# Patient Record
Sex: Female | Born: 1935 | Race: White | Hispanic: No | Marital: Married | Smoking: Never smoker
Health system: Southern US, Community
[De-identification: ages and names within clinical notes are randomized; demographics above are authoritative.]

## PROBLEM LIST (undated history)

## (undated) DIAGNOSIS — E119 Type 2 diabetes mellitus without complications: Secondary | ICD-10-CM

## (undated) DIAGNOSIS — E7800 Pure hypercholesterolemia, unspecified: Secondary | ICD-10-CM

## (undated) DIAGNOSIS — I10 Essential (primary) hypertension: Secondary | ICD-10-CM

## (undated) DIAGNOSIS — M109 Gout, unspecified: Secondary | ICD-10-CM

## (undated) DIAGNOSIS — I6529 Occlusion and stenosis of unspecified carotid artery: Secondary | ICD-10-CM

## (undated) HISTORY — PX: CERVICAL FUSION: SHX112

## (undated) HISTORY — PX: EYE SURGERY: SHX253

## (undated) HISTORY — PX: ACHILLES TENDON REPAIR: SUR1153

## (undated) HISTORY — PX: KNEE SURGERY: SHX244

## (undated) HISTORY — PX: ABDOMINAL HYSTERECTOMY: SHX81

## (undated) HISTORY — DX: Occlusion and stenosis of unspecified carotid artery: I65.29

## (undated) HISTORY — PX: TONSILLECTOMY: SUR1361

---

## 2011-01-25 DIAGNOSIS — E119 Type 2 diabetes mellitus without complications: Secondary | ICD-10-CM | POA: Diagnosis not present

## 2011-01-25 DIAGNOSIS — I10 Essential (primary) hypertension: Secondary | ICD-10-CM | POA: Diagnosis not present

## 2011-02-08 DIAGNOSIS — N19 Unspecified kidney failure: Secondary | ICD-10-CM | POA: Diagnosis not present

## 2011-02-08 DIAGNOSIS — I10 Essential (primary) hypertension: Secondary | ICD-10-CM | POA: Diagnosis not present

## 2011-02-08 DIAGNOSIS — E119 Type 2 diabetes mellitus without complications: Secondary | ICD-10-CM | POA: Diagnosis not present

## 2011-02-08 DIAGNOSIS — E785 Hyperlipidemia, unspecified: Secondary | ICD-10-CM | POA: Diagnosis not present

## 2011-02-26 DIAGNOSIS — E119 Type 2 diabetes mellitus without complications: Secondary | ICD-10-CM | POA: Diagnosis not present

## 2011-02-26 DIAGNOSIS — L259 Unspecified contact dermatitis, unspecified cause: Secondary | ICD-10-CM | POA: Diagnosis not present

## 2011-06-01 DIAGNOSIS — I10 Essential (primary) hypertension: Secondary | ICD-10-CM | POA: Diagnosis not present

## 2011-06-01 DIAGNOSIS — E785 Hyperlipidemia, unspecified: Secondary | ICD-10-CM | POA: Diagnosis not present

## 2011-06-01 DIAGNOSIS — E119 Type 2 diabetes mellitus without complications: Secondary | ICD-10-CM | POA: Diagnosis not present

## 2011-06-06 DIAGNOSIS — M79609 Pain in unspecified limb: Secondary | ICD-10-CM | POA: Diagnosis not present

## 2011-06-06 DIAGNOSIS — B351 Tinea unguium: Secondary | ICD-10-CM | POA: Diagnosis not present

## 2011-06-06 DIAGNOSIS — E1349 Other specified diabetes mellitus with other diabetic neurological complication: Secondary | ICD-10-CM | POA: Diagnosis not present

## 2011-06-14 DIAGNOSIS — E119 Type 2 diabetes mellitus without complications: Secondary | ICD-10-CM | POA: Diagnosis not present

## 2011-06-14 DIAGNOSIS — H43819 Vitreous degeneration, unspecified eye: Secondary | ICD-10-CM | POA: Diagnosis not present

## 2011-06-26 DIAGNOSIS — N19 Unspecified kidney failure: Secondary | ICD-10-CM | POA: Diagnosis not present

## 2011-06-26 DIAGNOSIS — I10 Essential (primary) hypertension: Secondary | ICD-10-CM | POA: Diagnosis not present

## 2011-06-26 DIAGNOSIS — E119 Type 2 diabetes mellitus without complications: Secondary | ICD-10-CM | POA: Diagnosis not present

## 2011-06-26 DIAGNOSIS — E785 Hyperlipidemia, unspecified: Secondary | ICD-10-CM | POA: Diagnosis not present

## 2011-06-26 DIAGNOSIS — D509 Iron deficiency anemia, unspecified: Secondary | ICD-10-CM | POA: Diagnosis not present

## 2011-06-26 DIAGNOSIS — K921 Melena: Secondary | ICD-10-CM | POA: Diagnosis not present

## 2011-06-26 DIAGNOSIS — Z79899 Other long term (current) drug therapy: Secondary | ICD-10-CM | POA: Diagnosis not present

## 2011-06-28 DIAGNOSIS — Z1231 Encounter for screening mammogram for malignant neoplasm of breast: Secondary | ICD-10-CM | POA: Diagnosis not present

## 2011-07-02 DIAGNOSIS — E785 Hyperlipidemia, unspecified: Secondary | ICD-10-CM | POA: Diagnosis not present

## 2011-07-02 DIAGNOSIS — K921 Melena: Secondary | ICD-10-CM | POA: Diagnosis not present

## 2011-07-02 DIAGNOSIS — I10 Essential (primary) hypertension: Secondary | ICD-10-CM | POA: Diagnosis not present

## 2011-07-02 DIAGNOSIS — E119 Type 2 diabetes mellitus without complications: Secondary | ICD-10-CM | POA: Diagnosis not present

## 2011-07-02 DIAGNOSIS — N19 Unspecified kidney failure: Secondary | ICD-10-CM | POA: Diagnosis not present

## 2011-07-02 DIAGNOSIS — D509 Iron deficiency anemia, unspecified: Secondary | ICD-10-CM | POA: Diagnosis not present

## 2011-07-05 DIAGNOSIS — M545 Low back pain, unspecified: Secondary | ICD-10-CM | POA: Diagnosis not present

## 2011-07-05 DIAGNOSIS — M5430 Sciatica, unspecified side: Secondary | ICD-10-CM | POA: Diagnosis not present

## 2011-07-05 DIAGNOSIS — M5137 Other intervertebral disc degeneration, lumbosacral region: Secondary | ICD-10-CM | POA: Diagnosis not present

## 2011-07-06 DIAGNOSIS — E119 Type 2 diabetes mellitus without complications: Secondary | ICD-10-CM | POA: Diagnosis not present

## 2011-07-06 DIAGNOSIS — E782 Mixed hyperlipidemia: Secondary | ICD-10-CM | POA: Diagnosis not present

## 2011-07-06 DIAGNOSIS — I10 Essential (primary) hypertension: Secondary | ICD-10-CM | POA: Diagnosis not present

## 2011-07-06 DIAGNOSIS — I679 Cerebrovascular disease, unspecified: Secondary | ICD-10-CM | POA: Diagnosis not present

## 2011-07-14 DIAGNOSIS — M4120 Other idiopathic scoliosis, site unspecified: Secondary | ICD-10-CM | POA: Diagnosis not present

## 2011-07-14 DIAGNOSIS — M545 Low back pain, unspecified: Secondary | ICD-10-CM | POA: Diagnosis not present

## 2011-07-19 DIAGNOSIS — I10 Essential (primary) hypertension: Secondary | ICD-10-CM | POA: Diagnosis not present

## 2011-07-19 DIAGNOSIS — E785 Hyperlipidemia, unspecified: Secondary | ICD-10-CM | POA: Diagnosis not present

## 2011-07-19 DIAGNOSIS — E119 Type 2 diabetes mellitus without complications: Secondary | ICD-10-CM | POA: Diagnosis not present

## 2011-07-25 DIAGNOSIS — K921 Melena: Secondary | ICD-10-CM | POA: Diagnosis not present

## 2011-08-15 DIAGNOSIS — M545 Low back pain, unspecified: Secondary | ICD-10-CM | POA: Diagnosis not present

## 2011-08-15 DIAGNOSIS — M5137 Other intervertebral disc degeneration, lumbosacral region: Secondary | ICD-10-CM | POA: Diagnosis not present

## 2011-08-15 DIAGNOSIS — M5430 Sciatica, unspecified side: Secondary | ICD-10-CM | POA: Diagnosis not present

## 2011-09-03 DIAGNOSIS — H109 Unspecified conjunctivitis: Secondary | ICD-10-CM | POA: Diagnosis not present

## 2011-09-03 DIAGNOSIS — E119 Type 2 diabetes mellitus without complications: Secondary | ICD-10-CM | POA: Diagnosis not present

## 2011-09-03 DIAGNOSIS — I10 Essential (primary) hypertension: Secondary | ICD-10-CM | POA: Diagnosis not present

## 2011-09-10 DIAGNOSIS — H109 Unspecified conjunctivitis: Secondary | ICD-10-CM | POA: Diagnosis not present

## 2011-09-19 DIAGNOSIS — H905 Unspecified sensorineural hearing loss: Secondary | ICD-10-CM | POA: Diagnosis not present

## 2011-10-01 DIAGNOSIS — B351 Tinea unguium: Secondary | ICD-10-CM | POA: Diagnosis not present

## 2011-10-01 DIAGNOSIS — E1349 Other specified diabetes mellitus with other diabetic neurological complication: Secondary | ICD-10-CM | POA: Diagnosis not present

## 2011-10-04 DIAGNOSIS — Z23 Encounter for immunization: Secondary | ICD-10-CM | POA: Diagnosis not present

## 2011-10-30 DIAGNOSIS — Z01419 Encounter for gynecological examination (general) (routine) without abnormal findings: Secondary | ICD-10-CM | POA: Diagnosis not present

## 2011-10-30 DIAGNOSIS — Z124 Encounter for screening for malignant neoplasm of cervix: Secondary | ICD-10-CM | POA: Diagnosis not present

## 2011-12-27 DIAGNOSIS — Z79899 Other long term (current) drug therapy: Secondary | ICD-10-CM | POA: Diagnosis not present

## 2011-12-27 DIAGNOSIS — E785 Hyperlipidemia, unspecified: Secondary | ICD-10-CM | POA: Diagnosis not present

## 2012-01-04 DIAGNOSIS — I679 Cerebrovascular disease, unspecified: Secondary | ICD-10-CM | POA: Diagnosis not present

## 2012-01-04 DIAGNOSIS — E782 Mixed hyperlipidemia: Secondary | ICD-10-CM | POA: Diagnosis not present

## 2012-01-04 DIAGNOSIS — I10 Essential (primary) hypertension: Secondary | ICD-10-CM | POA: Diagnosis not present

## 2012-01-29 DIAGNOSIS — E1349 Other specified diabetes mellitus with other diabetic neurological complication: Secondary | ICD-10-CM | POA: Diagnosis not present

## 2012-01-29 DIAGNOSIS — L600 Ingrowing nail: Secondary | ICD-10-CM | POA: Diagnosis not present

## 2012-01-29 DIAGNOSIS — B351 Tinea unguium: Secondary | ICD-10-CM | POA: Diagnosis not present

## 2012-04-17 DIAGNOSIS — E785 Hyperlipidemia, unspecified: Secondary | ICD-10-CM | POA: Diagnosis not present

## 2012-04-17 DIAGNOSIS — N289 Disorder of kidney and ureter, unspecified: Secondary | ICD-10-CM | POA: Diagnosis not present

## 2012-04-17 DIAGNOSIS — Z79899 Other long term (current) drug therapy: Secondary | ICD-10-CM | POA: Diagnosis not present

## 2012-04-25 DIAGNOSIS — E782 Mixed hyperlipidemia: Secondary | ICD-10-CM | POA: Diagnosis not present

## 2012-04-25 DIAGNOSIS — E8881 Metabolic syndrome: Secondary | ICD-10-CM | POA: Diagnosis not present

## 2012-04-25 DIAGNOSIS — I10 Essential (primary) hypertension: Secondary | ICD-10-CM | POA: Diagnosis not present

## 2012-04-25 DIAGNOSIS — I679 Cerebrovascular disease, unspecified: Secondary | ICD-10-CM | POA: Diagnosis not present

## 2012-05-08 DIAGNOSIS — R279 Unspecified lack of coordination: Secondary | ICD-10-CM | POA: Diagnosis not present

## 2012-05-08 DIAGNOSIS — R262 Difficulty in walking, not elsewhere classified: Secondary | ICD-10-CM | POA: Diagnosis not present

## 2012-05-08 DIAGNOSIS — Z9181 History of falling: Secondary | ICD-10-CM | POA: Diagnosis not present

## 2012-05-08 DIAGNOSIS — R269 Unspecified abnormalities of gait and mobility: Secondary | ICD-10-CM | POA: Diagnosis not present

## 2012-05-12 DIAGNOSIS — R262 Difficulty in walking, not elsewhere classified: Secondary | ICD-10-CM | POA: Diagnosis not present

## 2012-05-12 DIAGNOSIS — R279 Unspecified lack of coordination: Secondary | ICD-10-CM | POA: Diagnosis not present

## 2012-05-12 DIAGNOSIS — Z9181 History of falling: Secondary | ICD-10-CM | POA: Diagnosis not present

## 2012-05-12 DIAGNOSIS — R269 Unspecified abnormalities of gait and mobility: Secondary | ICD-10-CM | POA: Diagnosis not present

## 2012-05-16 DIAGNOSIS — Z9181 History of falling: Secondary | ICD-10-CM | POA: Diagnosis not present

## 2012-05-16 DIAGNOSIS — R262 Difficulty in walking, not elsewhere classified: Secondary | ICD-10-CM | POA: Diagnosis not present

## 2012-05-16 DIAGNOSIS — R279 Unspecified lack of coordination: Secondary | ICD-10-CM | POA: Diagnosis not present

## 2012-05-16 DIAGNOSIS — R269 Unspecified abnormalities of gait and mobility: Secondary | ICD-10-CM | POA: Diagnosis not present

## 2012-05-20 DIAGNOSIS — R262 Difficulty in walking, not elsewhere classified: Secondary | ICD-10-CM | POA: Diagnosis not present

## 2012-05-20 DIAGNOSIS — Z9181 History of falling: Secondary | ICD-10-CM | POA: Diagnosis not present

## 2012-05-20 DIAGNOSIS — R269 Unspecified abnormalities of gait and mobility: Secondary | ICD-10-CM | POA: Diagnosis not present

## 2012-05-20 DIAGNOSIS — R279 Unspecified lack of coordination: Secondary | ICD-10-CM | POA: Diagnosis not present

## 2012-05-22 DIAGNOSIS — R262 Difficulty in walking, not elsewhere classified: Secondary | ICD-10-CM | POA: Diagnosis not present

## 2012-05-22 DIAGNOSIS — R269 Unspecified abnormalities of gait and mobility: Secondary | ICD-10-CM | POA: Diagnosis not present

## 2012-05-22 DIAGNOSIS — Z9181 History of falling: Secondary | ICD-10-CM | POA: Diagnosis not present

## 2012-05-22 DIAGNOSIS — R279 Unspecified lack of coordination: Secondary | ICD-10-CM | POA: Diagnosis not present

## 2012-05-26 DIAGNOSIS — R262 Difficulty in walking, not elsewhere classified: Secondary | ICD-10-CM | POA: Diagnosis not present

## 2012-05-26 DIAGNOSIS — R269 Unspecified abnormalities of gait and mobility: Secondary | ICD-10-CM | POA: Diagnosis not present

## 2012-05-26 DIAGNOSIS — R279 Unspecified lack of coordination: Secondary | ICD-10-CM | POA: Diagnosis not present

## 2012-05-26 DIAGNOSIS — Z9181 History of falling: Secondary | ICD-10-CM | POA: Diagnosis not present

## 2012-05-27 DIAGNOSIS — B351 Tinea unguium: Secondary | ICD-10-CM | POA: Diagnosis not present

## 2012-05-27 DIAGNOSIS — E1349 Other specified diabetes mellitus with other diabetic neurological complication: Secondary | ICD-10-CM | POA: Diagnosis not present

## 2012-05-30 DIAGNOSIS — R262 Difficulty in walking, not elsewhere classified: Secondary | ICD-10-CM | POA: Diagnosis not present

## 2012-05-30 DIAGNOSIS — Z9181 History of falling: Secondary | ICD-10-CM | POA: Diagnosis not present

## 2012-05-30 DIAGNOSIS — R269 Unspecified abnormalities of gait and mobility: Secondary | ICD-10-CM | POA: Diagnosis not present

## 2012-05-30 DIAGNOSIS — R279 Unspecified lack of coordination: Secondary | ICD-10-CM | POA: Diagnosis not present

## 2012-06-02 DIAGNOSIS — R269 Unspecified abnormalities of gait and mobility: Secondary | ICD-10-CM | POA: Diagnosis not present

## 2012-06-02 DIAGNOSIS — Z9181 History of falling: Secondary | ICD-10-CM | POA: Diagnosis not present

## 2012-06-02 DIAGNOSIS — R279 Unspecified lack of coordination: Secondary | ICD-10-CM | POA: Diagnosis not present

## 2012-06-02 DIAGNOSIS — R262 Difficulty in walking, not elsewhere classified: Secondary | ICD-10-CM | POA: Diagnosis not present

## 2012-06-05 DIAGNOSIS — Z9181 History of falling: Secondary | ICD-10-CM | POA: Diagnosis not present

## 2012-06-05 DIAGNOSIS — R262 Difficulty in walking, not elsewhere classified: Secondary | ICD-10-CM | POA: Diagnosis not present

## 2012-06-05 DIAGNOSIS — R269 Unspecified abnormalities of gait and mobility: Secondary | ICD-10-CM | POA: Diagnosis not present

## 2012-06-05 DIAGNOSIS — R279 Unspecified lack of coordination: Secondary | ICD-10-CM | POA: Diagnosis not present

## 2012-06-06 DIAGNOSIS — L719 Rosacea, unspecified: Secondary | ICD-10-CM | POA: Diagnosis not present

## 2012-06-06 DIAGNOSIS — I10 Essential (primary) hypertension: Secondary | ICD-10-CM | POA: Diagnosis not present

## 2012-06-06 DIAGNOSIS — E785 Hyperlipidemia, unspecified: Secondary | ICD-10-CM | POA: Diagnosis not present

## 2012-06-06 DIAGNOSIS — E119 Type 2 diabetes mellitus without complications: Secondary | ICD-10-CM | POA: Diagnosis not present

## 2012-06-10 DIAGNOSIS — R279 Unspecified lack of coordination: Secondary | ICD-10-CM | POA: Diagnosis not present

## 2012-06-10 DIAGNOSIS — Z9181 History of falling: Secondary | ICD-10-CM | POA: Diagnosis not present

## 2012-06-10 DIAGNOSIS — R262 Difficulty in walking, not elsewhere classified: Secondary | ICD-10-CM | POA: Diagnosis not present

## 2012-06-10 DIAGNOSIS — R269 Unspecified abnormalities of gait and mobility: Secondary | ICD-10-CM | POA: Diagnosis not present

## 2012-06-12 DIAGNOSIS — I10 Essential (primary) hypertension: Secondary | ICD-10-CM | POA: Diagnosis not present

## 2012-06-12 DIAGNOSIS — N19 Unspecified kidney failure: Secondary | ICD-10-CM | POA: Diagnosis not present

## 2012-06-12 DIAGNOSIS — R262 Difficulty in walking, not elsewhere classified: Secondary | ICD-10-CM | POA: Diagnosis not present

## 2012-06-12 DIAGNOSIS — R279 Unspecified lack of coordination: Secondary | ICD-10-CM | POA: Diagnosis not present

## 2012-06-12 DIAGNOSIS — Z9181 History of falling: Secondary | ICD-10-CM | POA: Diagnosis not present

## 2012-06-12 DIAGNOSIS — Z79899 Other long term (current) drug therapy: Secondary | ICD-10-CM | POA: Diagnosis not present

## 2012-06-12 DIAGNOSIS — E119 Type 2 diabetes mellitus without complications: Secondary | ICD-10-CM | POA: Diagnosis not present

## 2012-06-12 DIAGNOSIS — K921 Melena: Secondary | ICD-10-CM | POA: Diagnosis not present

## 2012-06-12 DIAGNOSIS — E785 Hyperlipidemia, unspecified: Secondary | ICD-10-CM | POA: Diagnosis not present

## 2012-06-12 DIAGNOSIS — R269 Unspecified abnormalities of gait and mobility: Secondary | ICD-10-CM | POA: Diagnosis not present

## 2012-06-17 DIAGNOSIS — R269 Unspecified abnormalities of gait and mobility: Secondary | ICD-10-CM | POA: Diagnosis not present

## 2012-06-17 DIAGNOSIS — Z9181 History of falling: Secondary | ICD-10-CM | POA: Diagnosis not present

## 2012-06-17 DIAGNOSIS — R279 Unspecified lack of coordination: Secondary | ICD-10-CM | POA: Diagnosis not present

## 2012-06-17 DIAGNOSIS — R262 Difficulty in walking, not elsewhere classified: Secondary | ICD-10-CM | POA: Diagnosis not present

## 2012-06-19 DIAGNOSIS — K921 Melena: Secondary | ICD-10-CM | POA: Diagnosis not present

## 2012-06-19 DIAGNOSIS — N19 Unspecified kidney failure: Secondary | ICD-10-CM | POA: Diagnosis not present

## 2012-06-19 DIAGNOSIS — E785 Hyperlipidemia, unspecified: Secondary | ICD-10-CM | POA: Diagnosis not present

## 2012-06-19 DIAGNOSIS — Z9181 History of falling: Secondary | ICD-10-CM | POA: Diagnosis not present

## 2012-06-19 DIAGNOSIS — I10 Essential (primary) hypertension: Secondary | ICD-10-CM | POA: Diagnosis not present

## 2012-06-19 DIAGNOSIS — R262 Difficulty in walking, not elsewhere classified: Secondary | ICD-10-CM | POA: Diagnosis not present

## 2012-06-19 DIAGNOSIS — Z79899 Other long term (current) drug therapy: Secondary | ICD-10-CM | POA: Diagnosis not present

## 2012-06-19 DIAGNOSIS — R269 Unspecified abnormalities of gait and mobility: Secondary | ICD-10-CM | POA: Diagnosis not present

## 2012-06-19 DIAGNOSIS — R279 Unspecified lack of coordination: Secondary | ICD-10-CM | POA: Diagnosis not present

## 2012-06-19 DIAGNOSIS — E119 Type 2 diabetes mellitus without complications: Secondary | ICD-10-CM | POA: Diagnosis not present

## 2012-06-24 DIAGNOSIS — Z9181 History of falling: Secondary | ICD-10-CM | POA: Diagnosis not present

## 2012-06-24 DIAGNOSIS — R279 Unspecified lack of coordination: Secondary | ICD-10-CM | POA: Diagnosis not present

## 2012-06-24 DIAGNOSIS — R262 Difficulty in walking, not elsewhere classified: Secondary | ICD-10-CM | POA: Diagnosis not present

## 2012-06-24 DIAGNOSIS — R269 Unspecified abnormalities of gait and mobility: Secondary | ICD-10-CM | POA: Diagnosis not present

## 2012-06-26 DIAGNOSIS — E119 Type 2 diabetes mellitus without complications: Secondary | ICD-10-CM | POA: Diagnosis not present

## 2012-06-26 DIAGNOSIS — R279 Unspecified lack of coordination: Secondary | ICD-10-CM | POA: Diagnosis not present

## 2012-06-26 DIAGNOSIS — Z9181 History of falling: Secondary | ICD-10-CM | POA: Diagnosis not present

## 2012-06-26 DIAGNOSIS — R7989 Other specified abnormal findings of blood chemistry: Secondary | ICD-10-CM | POA: Diagnosis not present

## 2012-06-26 DIAGNOSIS — R262 Difficulty in walking, not elsewhere classified: Secondary | ICD-10-CM | POA: Diagnosis not present

## 2012-06-26 DIAGNOSIS — M79609 Pain in unspecified limb: Secondary | ICD-10-CM | POA: Diagnosis not present

## 2012-06-26 DIAGNOSIS — E785 Hyperlipidemia, unspecified: Secondary | ICD-10-CM | POA: Diagnosis not present

## 2012-06-26 DIAGNOSIS — Z79899 Other long term (current) drug therapy: Secondary | ICD-10-CM | POA: Diagnosis not present

## 2012-06-26 DIAGNOSIS — R269 Unspecified abnormalities of gait and mobility: Secondary | ICD-10-CM | POA: Diagnosis not present

## 2012-06-26 DIAGNOSIS — I10 Essential (primary) hypertension: Secondary | ICD-10-CM | POA: Diagnosis not present

## 2012-06-26 DIAGNOSIS — R700 Elevated erythrocyte sedimentation rate: Secondary | ICD-10-CM | POA: Diagnosis not present

## 2012-06-26 DIAGNOSIS — M109 Gout, unspecified: Secondary | ICD-10-CM | POA: Diagnosis not present

## 2012-06-26 DIAGNOSIS — M7989 Other specified soft tissue disorders: Secondary | ICD-10-CM | POA: Diagnosis not present

## 2012-06-30 DIAGNOSIS — R7989 Other specified abnormal findings of blood chemistry: Secondary | ICD-10-CM | POA: Diagnosis not present

## 2012-07-01 DIAGNOSIS — R262 Difficulty in walking, not elsewhere classified: Secondary | ICD-10-CM | POA: Diagnosis not present

## 2012-07-01 DIAGNOSIS — R269 Unspecified abnormalities of gait and mobility: Secondary | ICD-10-CM | POA: Diagnosis not present

## 2012-07-01 DIAGNOSIS — R279 Unspecified lack of coordination: Secondary | ICD-10-CM | POA: Diagnosis not present

## 2012-07-01 DIAGNOSIS — Z1231 Encounter for screening mammogram for malignant neoplasm of breast: Secondary | ICD-10-CM | POA: Diagnosis not present

## 2012-07-01 DIAGNOSIS — Z9181 History of falling: Secondary | ICD-10-CM | POA: Diagnosis not present

## 2012-07-02 DIAGNOSIS — E785 Hyperlipidemia, unspecified: Secondary | ICD-10-CM | POA: Diagnosis not present

## 2012-07-02 DIAGNOSIS — E119 Type 2 diabetes mellitus without complications: Secondary | ICD-10-CM | POA: Diagnosis not present

## 2012-07-02 DIAGNOSIS — M109 Gout, unspecified: Secondary | ICD-10-CM | POA: Diagnosis not present

## 2012-07-02 DIAGNOSIS — I10 Essential (primary) hypertension: Secondary | ICD-10-CM | POA: Diagnosis not present

## 2012-07-02 DIAGNOSIS — R7989 Other specified abnormal findings of blood chemistry: Secondary | ICD-10-CM | POA: Diagnosis not present

## 2012-07-04 DIAGNOSIS — R262 Difficulty in walking, not elsewhere classified: Secondary | ICD-10-CM | POA: Diagnosis not present

## 2012-07-04 DIAGNOSIS — R269 Unspecified abnormalities of gait and mobility: Secondary | ICD-10-CM | POA: Diagnosis not present

## 2012-07-04 DIAGNOSIS — Z9181 History of falling: Secondary | ICD-10-CM | POA: Diagnosis not present

## 2012-07-04 DIAGNOSIS — R279 Unspecified lack of coordination: Secondary | ICD-10-CM | POA: Diagnosis not present

## 2012-07-07 DIAGNOSIS — R279 Unspecified lack of coordination: Secondary | ICD-10-CM | POA: Diagnosis not present

## 2012-07-07 DIAGNOSIS — R269 Unspecified abnormalities of gait and mobility: Secondary | ICD-10-CM | POA: Diagnosis not present

## 2012-07-07 DIAGNOSIS — Z9181 History of falling: Secondary | ICD-10-CM | POA: Diagnosis not present

## 2012-07-07 DIAGNOSIS — R262 Difficulty in walking, not elsewhere classified: Secondary | ICD-10-CM | POA: Diagnosis not present

## 2012-07-16 DIAGNOSIS — M109 Gout, unspecified: Secondary | ICD-10-CM | POA: Diagnosis not present

## 2012-07-16 DIAGNOSIS — I10 Essential (primary) hypertension: Secondary | ICD-10-CM | POA: Diagnosis not present

## 2012-07-17 DIAGNOSIS — R279 Unspecified lack of coordination: Secondary | ICD-10-CM | POA: Diagnosis not present

## 2012-07-17 DIAGNOSIS — Z9181 History of falling: Secondary | ICD-10-CM | POA: Diagnosis not present

## 2012-07-17 DIAGNOSIS — R269 Unspecified abnormalities of gait and mobility: Secondary | ICD-10-CM | POA: Diagnosis not present

## 2012-07-17 DIAGNOSIS — R262 Difficulty in walking, not elsewhere classified: Secondary | ICD-10-CM | POA: Diagnosis not present

## 2012-09-25 DIAGNOSIS — E785 Hyperlipidemia, unspecified: Secondary | ICD-10-CM | POA: Diagnosis not present

## 2012-09-25 DIAGNOSIS — Z79899 Other long term (current) drug therapy: Secondary | ICD-10-CM | POA: Diagnosis not present

## 2012-09-25 DIAGNOSIS — N289 Disorder of kidney and ureter, unspecified: Secondary | ICD-10-CM | POA: Diagnosis not present

## 2012-09-26 DIAGNOSIS — E119 Type 2 diabetes mellitus without complications: Secondary | ICD-10-CM | POA: Diagnosis not present

## 2012-09-26 DIAGNOSIS — H26499 Other secondary cataract, unspecified eye: Secondary | ICD-10-CM | POA: Diagnosis not present

## 2012-09-30 DIAGNOSIS — E1349 Other specified diabetes mellitus with other diabetic neurological complication: Secondary | ICD-10-CM | POA: Diagnosis not present

## 2012-09-30 DIAGNOSIS — B351 Tinea unguium: Secondary | ICD-10-CM | POA: Diagnosis not present

## 2012-10-06 DIAGNOSIS — E782 Mixed hyperlipidemia: Secondary | ICD-10-CM | POA: Diagnosis not present

## 2012-10-06 DIAGNOSIS — M109 Gout, unspecified: Secondary | ICD-10-CM | POA: Diagnosis not present

## 2012-10-06 DIAGNOSIS — I10 Essential (primary) hypertension: Secondary | ICD-10-CM | POA: Diagnosis not present

## 2012-10-06 DIAGNOSIS — I679 Cerebrovascular disease, unspecified: Secondary | ICD-10-CM | POA: Diagnosis not present

## 2012-10-09 DIAGNOSIS — I10 Essential (primary) hypertension: Secondary | ICD-10-CM | POA: Diagnosis not present

## 2012-10-09 DIAGNOSIS — M109 Gout, unspecified: Secondary | ICD-10-CM | POA: Diagnosis not present

## 2012-10-09 DIAGNOSIS — E785 Hyperlipidemia, unspecified: Secondary | ICD-10-CM | POA: Diagnosis not present

## 2012-10-09 DIAGNOSIS — Z23 Encounter for immunization: Secondary | ICD-10-CM | POA: Diagnosis not present

## 2012-10-30 DIAGNOSIS — K921 Melena: Secondary | ICD-10-CM | POA: Diagnosis not present

## 2012-10-30 DIAGNOSIS — E119 Type 2 diabetes mellitus without complications: Secondary | ICD-10-CM | POA: Diagnosis not present

## 2012-10-30 DIAGNOSIS — Z79899 Other long term (current) drug therapy: Secondary | ICD-10-CM | POA: Diagnosis not present

## 2012-10-30 DIAGNOSIS — N19 Unspecified kidney failure: Secondary | ICD-10-CM | POA: Diagnosis not present

## 2012-10-30 DIAGNOSIS — I10 Essential (primary) hypertension: Secondary | ICD-10-CM | POA: Diagnosis not present

## 2012-10-30 DIAGNOSIS — E785 Hyperlipidemia, unspecified: Secondary | ICD-10-CM | POA: Diagnosis not present

## 2012-11-03 DIAGNOSIS — Z01419 Encounter for gynecological examination (general) (routine) without abnormal findings: Secondary | ICD-10-CM | POA: Diagnosis not present

## 2012-12-25 DIAGNOSIS — Z6832 Body mass index (BMI) 32.0-32.9, adult: Secondary | ICD-10-CM | POA: Diagnosis not present

## 2012-12-25 DIAGNOSIS — E1169 Type 2 diabetes mellitus with other specified complication: Secondary | ICD-10-CM | POA: Diagnosis not present

## 2012-12-25 DIAGNOSIS — I10 Essential (primary) hypertension: Secondary | ICD-10-CM | POA: Diagnosis not present

## 2012-12-25 DIAGNOSIS — E785 Hyperlipidemia, unspecified: Secondary | ICD-10-CM | POA: Diagnosis not present

## 2012-12-25 DIAGNOSIS — Z1331 Encounter for screening for depression: Secondary | ICD-10-CM | POA: Diagnosis not present

## 2012-12-25 DIAGNOSIS — M109 Gout, unspecified: Secondary | ICD-10-CM | POA: Diagnosis not present

## 2012-12-25 DIAGNOSIS — G459 Transient cerebral ischemic attack, unspecified: Secondary | ICD-10-CM | POA: Diagnosis not present

## 2012-12-25 DIAGNOSIS — B351 Tinea unguium: Secondary | ICD-10-CM | POA: Diagnosis not present

## 2012-12-30 DIAGNOSIS — E119 Type 2 diabetes mellitus without complications: Secondary | ICD-10-CM | POA: Diagnosis not present

## 2012-12-30 DIAGNOSIS — L608 Other nail disorders: Secondary | ICD-10-CM | POA: Diagnosis not present

## 2012-12-30 DIAGNOSIS — M2010 Hallux valgus (acquired), unspecified foot: Secondary | ICD-10-CM | POA: Diagnosis not present

## 2012-12-30 DIAGNOSIS — S9030XA Contusion of unspecified foot, initial encounter: Secondary | ICD-10-CM | POA: Diagnosis not present

## 2013-03-31 DIAGNOSIS — L608 Other nail disorders: Secondary | ICD-10-CM | POA: Diagnosis not present

## 2013-03-31 DIAGNOSIS — E119 Type 2 diabetes mellitus without complications: Secondary | ICD-10-CM | POA: Diagnosis not present

## 2013-04-29 DIAGNOSIS — Z6833 Body mass index (BMI) 33.0-33.9, adult: Secondary | ICD-10-CM | POA: Diagnosis not present

## 2013-04-29 DIAGNOSIS — E785 Hyperlipidemia, unspecified: Secondary | ICD-10-CM | POA: Diagnosis not present

## 2013-04-29 DIAGNOSIS — R42 Dizziness and giddiness: Secondary | ICD-10-CM | POA: Diagnosis not present

## 2013-04-29 DIAGNOSIS — I10 Essential (primary) hypertension: Secondary | ICD-10-CM | POA: Diagnosis not present

## 2013-04-29 DIAGNOSIS — E119 Type 2 diabetes mellitus without complications: Secondary | ICD-10-CM | POA: Diagnosis not present

## 2013-04-29 DIAGNOSIS — R059 Cough, unspecified: Secondary | ICD-10-CM | POA: Diagnosis not present

## 2013-04-29 DIAGNOSIS — M79609 Pain in unspecified limb: Secondary | ICD-10-CM | POA: Diagnosis not present

## 2013-04-29 DIAGNOSIS — R05 Cough: Secondary | ICD-10-CM | POA: Diagnosis not present

## 2013-05-11 DIAGNOSIS — E119 Type 2 diabetes mellitus without complications: Secondary | ICD-10-CM | POA: Diagnosis not present

## 2013-05-11 DIAGNOSIS — H2640 Unspecified secondary cataract: Secondary | ICD-10-CM | POA: Diagnosis not present

## 2013-05-11 DIAGNOSIS — Z961 Presence of intraocular lens: Secondary | ICD-10-CM | POA: Diagnosis not present

## 2013-05-26 DIAGNOSIS — H2640 Unspecified secondary cataract: Secondary | ICD-10-CM | POA: Diagnosis not present

## 2013-05-26 DIAGNOSIS — H26499 Other secondary cataract, unspecified eye: Secondary | ICD-10-CM | POA: Diagnosis not present

## 2013-06-18 DIAGNOSIS — K5732 Diverticulitis of large intestine without perforation or abscess without bleeding: Secondary | ICD-10-CM | POA: Diagnosis not present

## 2013-06-18 DIAGNOSIS — Z6833 Body mass index (BMI) 33.0-33.9, adult: Secondary | ICD-10-CM | POA: Diagnosis not present

## 2013-06-30 DIAGNOSIS — L608 Other nail disorders: Secondary | ICD-10-CM | POA: Diagnosis not present

## 2013-06-30 DIAGNOSIS — E119 Type 2 diabetes mellitus without complications: Secondary | ICD-10-CM | POA: Diagnosis not present

## 2013-09-01 DIAGNOSIS — E119 Type 2 diabetes mellitus without complications: Secondary | ICD-10-CM | POA: Diagnosis not present

## 2013-09-01 DIAGNOSIS — M7660 Achilles tendinitis, unspecified leg: Secondary | ICD-10-CM | POA: Diagnosis not present

## 2013-09-01 DIAGNOSIS — L608 Other nail disorders: Secondary | ICD-10-CM | POA: Diagnosis not present

## 2013-09-01 DIAGNOSIS — M898X9 Other specified disorders of bone, unspecified site: Secondary | ICD-10-CM | POA: Diagnosis not present

## 2013-09-23 DIAGNOSIS — M7660 Achilles tendinitis, unspecified leg: Secondary | ICD-10-CM | POA: Diagnosis not present

## 2013-10-27 DIAGNOSIS — M109 Gout, unspecified: Secondary | ICD-10-CM | POA: Diagnosis not present

## 2013-10-27 DIAGNOSIS — E119 Type 2 diabetes mellitus without complications: Secondary | ICD-10-CM | POA: Diagnosis not present

## 2013-10-27 DIAGNOSIS — I10 Essential (primary) hypertension: Secondary | ICD-10-CM | POA: Diagnosis not present

## 2013-10-27 DIAGNOSIS — R8299 Other abnormal findings in urine: Secondary | ICD-10-CM | POA: Diagnosis not present

## 2013-10-27 DIAGNOSIS — E785 Hyperlipidemia, unspecified: Secondary | ICD-10-CM | POA: Diagnosis not present

## 2013-10-27 DIAGNOSIS — E1151 Type 2 diabetes mellitus with diabetic peripheral angiopathy without gangrene: Secondary | ICD-10-CM | POA: Diagnosis not present

## 2013-11-05 ENCOUNTER — Other Ambulatory Visit: Payer: Self-pay | Admitting: Internal Medicine

## 2013-11-05 DIAGNOSIS — Z008 Encounter for other general examination: Secondary | ICD-10-CM | POA: Diagnosis not present

## 2013-11-05 DIAGNOSIS — Z23 Encounter for immunization: Secondary | ICD-10-CM | POA: Diagnosis not present

## 2013-11-05 DIAGNOSIS — B351 Tinea unguium: Secondary | ICD-10-CM | POA: Diagnosis not present

## 2013-11-05 DIAGNOSIS — M109 Gout, unspecified: Secondary | ICD-10-CM | POA: Diagnosis not present

## 2013-11-05 DIAGNOSIS — E119 Type 2 diabetes mellitus without complications: Secondary | ICD-10-CM | POA: Diagnosis not present

## 2013-11-05 DIAGNOSIS — E785 Hyperlipidemia, unspecified: Secondary | ICD-10-CM | POA: Diagnosis not present

## 2013-11-05 DIAGNOSIS — G459 Transient cerebral ischemic attack, unspecified: Secondary | ICD-10-CM | POA: Diagnosis not present

## 2013-11-05 DIAGNOSIS — Z1212 Encounter for screening for malignant neoplasm of rectum: Secondary | ICD-10-CM | POA: Diagnosis not present

## 2013-11-05 DIAGNOSIS — I10 Essential (primary) hypertension: Secondary | ICD-10-CM | POA: Diagnosis not present

## 2013-11-05 DIAGNOSIS — R42 Dizziness and giddiness: Secondary | ICD-10-CM | POA: Diagnosis not present

## 2013-11-05 DIAGNOSIS — Z1231 Encounter for screening mammogram for malignant neoplasm of breast: Secondary | ICD-10-CM

## 2013-11-09 DIAGNOSIS — L602 Onychogryphosis: Secondary | ICD-10-CM | POA: Diagnosis not present

## 2013-11-09 DIAGNOSIS — E119 Type 2 diabetes mellitus without complications: Secondary | ICD-10-CM | POA: Diagnosis not present

## 2013-11-13 DIAGNOSIS — L820 Inflamed seborrheic keratosis: Secondary | ICD-10-CM | POA: Diagnosis not present

## 2013-11-13 DIAGNOSIS — M109 Gout, unspecified: Secondary | ICD-10-CM | POA: Diagnosis not present

## 2013-11-13 DIAGNOSIS — L821 Other seborrheic keratosis: Secondary | ICD-10-CM | POA: Diagnosis not present

## 2013-11-13 DIAGNOSIS — E119 Type 2 diabetes mellitus without complications: Secondary | ICD-10-CM | POA: Diagnosis not present

## 2013-11-13 DIAGNOSIS — G459 Transient cerebral ischemic attack, unspecified: Secondary | ICD-10-CM | POA: Diagnosis not present

## 2013-11-13 DIAGNOSIS — B351 Tinea unguium: Secondary | ICD-10-CM | POA: Diagnosis not present

## 2013-11-13 DIAGNOSIS — Z008 Encounter for other general examination: Secondary | ICD-10-CM | POA: Diagnosis not present

## 2013-11-13 DIAGNOSIS — R42 Dizziness and giddiness: Secondary | ICD-10-CM | POA: Diagnosis not present

## 2013-11-13 DIAGNOSIS — I10 Essential (primary) hypertension: Secondary | ICD-10-CM | POA: Diagnosis not present

## 2013-11-13 DIAGNOSIS — L438 Other lichen planus: Secondary | ICD-10-CM | POA: Diagnosis not present

## 2013-11-13 DIAGNOSIS — E785 Hyperlipidemia, unspecified: Secondary | ICD-10-CM | POA: Diagnosis not present

## 2013-11-19 ENCOUNTER — Encounter (INDEPENDENT_AMBULATORY_CARE_PROVIDER_SITE_OTHER): Payer: Self-pay

## 2013-11-19 ENCOUNTER — Ambulatory Visit
Admission: RE | Admit: 2013-11-19 | Discharge: 2013-11-19 | Disposition: A | Discharge status: Home / Self Care | Payer: Medicare Other | Source: Ambulatory Visit | Attending: Internal Medicine | Admitting: Internal Medicine

## 2013-11-19 DIAGNOSIS — Z1231 Encounter for screening mammogram for malignant neoplasm of breast: Secondary | ICD-10-CM

## 2014-01-11 DIAGNOSIS — E1151 Type 2 diabetes mellitus with diabetic peripheral angiopathy without gangrene: Secondary | ICD-10-CM | POA: Diagnosis not present

## 2014-01-11 DIAGNOSIS — L84 Corns and callosities: Secondary | ICD-10-CM | POA: Diagnosis not present

## 2014-01-11 DIAGNOSIS — L602 Onychogryphosis: Secondary | ICD-10-CM | POA: Diagnosis not present

## 2014-02-03 DIAGNOSIS — E1151 Type 2 diabetes mellitus with diabetic peripheral angiopathy without gangrene: Secondary | ICD-10-CM | POA: Diagnosis not present

## 2014-02-03 DIAGNOSIS — M109 Gout, unspecified: Secondary | ICD-10-CM | POA: Diagnosis not present

## 2014-02-03 DIAGNOSIS — R42 Dizziness and giddiness: Secondary | ICD-10-CM | POA: Diagnosis not present

## 2014-02-03 DIAGNOSIS — Z6835 Body mass index (BMI) 35.0-35.9, adult: Secondary | ICD-10-CM | POA: Diagnosis not present

## 2014-02-03 DIAGNOSIS — I10 Essential (primary) hypertension: Secondary | ICD-10-CM | POA: Diagnosis not present

## 2014-02-03 DIAGNOSIS — E119 Type 2 diabetes mellitus without complications: Secondary | ICD-10-CM | POA: Diagnosis not present

## 2014-04-12 DIAGNOSIS — L84 Corns and callosities: Secondary | ICD-10-CM | POA: Diagnosis not present

## 2014-04-12 DIAGNOSIS — L602 Onychogryphosis: Secondary | ICD-10-CM | POA: Diagnosis not present

## 2014-04-12 DIAGNOSIS — E1151 Type 2 diabetes mellitus with diabetic peripheral angiopathy without gangrene: Secondary | ICD-10-CM | POA: Diagnosis not present

## 2014-06-04 DIAGNOSIS — Z6835 Body mass index (BMI) 35.0-35.9, adult: Secondary | ICD-10-CM | POA: Diagnosis not present

## 2014-06-04 DIAGNOSIS — E1151 Type 2 diabetes mellitus with diabetic peripheral angiopathy without gangrene: Secondary | ICD-10-CM | POA: Diagnosis not present

## 2014-06-04 DIAGNOSIS — I10 Essential (primary) hypertension: Secondary | ICD-10-CM | POA: Diagnosis not present

## 2014-06-04 DIAGNOSIS — E119 Type 2 diabetes mellitus without complications: Secondary | ICD-10-CM | POA: Diagnosis not present

## 2014-06-04 DIAGNOSIS — E785 Hyperlipidemia, unspecified: Secondary | ICD-10-CM | POA: Diagnosis not present

## 2014-06-04 DIAGNOSIS — M436 Torticollis: Secondary | ICD-10-CM | POA: Diagnosis not present

## 2014-07-14 DIAGNOSIS — E1151 Type 2 diabetes mellitus with diabetic peripheral angiopathy without gangrene: Secondary | ICD-10-CM | POA: Diagnosis not present

## 2014-07-14 DIAGNOSIS — L84 Corns and callosities: Secondary | ICD-10-CM | POA: Diagnosis not present

## 2014-07-14 DIAGNOSIS — L602 Onychogryphosis: Secondary | ICD-10-CM | POA: Diagnosis not present

## 2014-07-23 DIAGNOSIS — Z961 Presence of intraocular lens: Secondary | ICD-10-CM | POA: Diagnosis not present

## 2014-07-23 DIAGNOSIS — E119 Type 2 diabetes mellitus without complications: Secondary | ICD-10-CM | POA: Diagnosis not present

## 2014-08-24 ENCOUNTER — Encounter (HOSPITAL_COMMUNITY): Payer: Self-pay

## 2014-08-24 ENCOUNTER — Emergency Department (HOSPITAL_COMMUNITY): Payer: Medicare Other

## 2014-08-24 ENCOUNTER — Inpatient Hospital Stay (HOSPITAL_COMMUNITY)
Admission: EM | Admit: 2014-08-24 | Discharge: 2014-08-25 | DRG: 312 | Disposition: A | Discharge status: Home / Self Care | Payer: Medicare Other | Attending: Internal Medicine | Admitting: Internal Medicine

## 2014-08-24 DIAGNOSIS — Z888 Allergy status to other drugs, medicaments and biological substances status: Secondary | ICD-10-CM

## 2014-08-24 DIAGNOSIS — R55 Syncope and collapse: Secondary | ICD-10-CM | POA: Diagnosis not present

## 2014-08-24 DIAGNOSIS — M109 Gout, unspecified: Secondary | ICD-10-CM | POA: Diagnosis present

## 2014-08-24 DIAGNOSIS — W01198A Fall on same level from slipping, tripping and stumbling with subsequent striking against other object, initial encounter: Secondary | ICD-10-CM | POA: Diagnosis present

## 2014-08-24 DIAGNOSIS — R42 Dizziness and giddiness: Secondary | ICD-10-CM | POA: Diagnosis present

## 2014-08-24 DIAGNOSIS — Z7902 Long term (current) use of antithrombotics/antiplatelets: Secondary | ICD-10-CM | POA: Diagnosis not present

## 2014-08-24 DIAGNOSIS — R000 Tachycardia, unspecified: Secondary | ICD-10-CM | POA: Diagnosis not present

## 2014-08-24 DIAGNOSIS — E119 Type 2 diabetes mellitus without complications: Secondary | ICD-10-CM | POA: Diagnosis not present

## 2014-08-24 DIAGNOSIS — Z886 Allergy status to analgesic agent status: Secondary | ICD-10-CM | POA: Diagnosis not present

## 2014-08-24 DIAGNOSIS — Y929 Unspecified place or not applicable: Secondary | ICD-10-CM | POA: Diagnosis not present

## 2014-08-24 DIAGNOSIS — S2231XA Fracture of one rib, right side, initial encounter for closed fracture: Secondary | ICD-10-CM

## 2014-08-24 DIAGNOSIS — Z91010 Allergy to peanuts: Secondary | ICD-10-CM

## 2014-08-24 DIAGNOSIS — E785 Hyperlipidemia, unspecified: Secondary | ICD-10-CM

## 2014-08-24 DIAGNOSIS — Z91018 Allergy to other foods: Secondary | ICD-10-CM

## 2014-08-24 DIAGNOSIS — S299XXA Unspecified injury of thorax, initial encounter: Secondary | ICD-10-CM | POA: Diagnosis not present

## 2014-08-24 DIAGNOSIS — S199XXA Unspecified injury of neck, initial encounter: Secondary | ICD-10-CM | POA: Diagnosis not present

## 2014-08-24 DIAGNOSIS — R0781 Pleurodynia: Secondary | ICD-10-CM | POA: Diagnosis not present

## 2014-08-24 DIAGNOSIS — Z79899 Other long term (current) drug therapy: Secondary | ICD-10-CM

## 2014-08-24 DIAGNOSIS — I10 Essential (primary) hypertension: Secondary | ICD-10-CM

## 2014-08-24 DIAGNOSIS — R0789 Other chest pain: Secondary | ICD-10-CM | POA: Diagnosis not present

## 2014-08-24 DIAGNOSIS — S0990XA Unspecified injury of head, initial encounter: Secondary | ICD-10-CM | POA: Diagnosis not present

## 2014-08-24 DIAGNOSIS — W19XXXA Unspecified fall, initial encounter: Secondary | ICD-10-CM

## 2014-08-24 HISTORY — DX: Type 2 diabetes mellitus without complications: E11.9

## 2014-08-24 HISTORY — DX: Essential (primary) hypertension: I10

## 2014-08-24 HISTORY — DX: Gout, unspecified: M10.9

## 2014-08-24 HISTORY — DX: Pure hypercholesterolemia, unspecified: E78.00

## 2014-08-24 LAB — TROPONIN I
Troponin I: <0.03 ng/mL (ref <0.031)
Troponin I: <0.03 ng/mL (ref <0.031)
Troponin I: <0.03 ng/mL (ref <0.031)

## 2014-08-24 LAB — CBC WITH DIFFERENTIAL/PLATELET
Basophils Absolute: 0 10*3/uL (ref 0.0–0.1)
Basophils Relative: 0 % (ref 0–1)
Eosinophils Absolute: 0.1 10*3/uL (ref 0.0–0.7)
Eosinophils Relative: 1 % (ref 0–5)
HCT: 43.6 % (ref 36.0–46.0)
Hemoglobin: 14.3 g/dL (ref 12.0–15.0)
Lymphocytes Relative: 8 % — ABNORMAL LOW (ref 12–46)
Lymphs Abs: 0.7 10*3/uL (ref 0.7–4.0)
MCH: 30.6 pg (ref 26.0–34.0)
MCHC: 32.8 g/dL (ref 30.0–36.0)
MCV: 93.2 fL (ref 78.0–100.0)
Monocytes Absolute: 0.4 10*3/uL (ref 0.1–1.0)
Monocytes Relative: 5 % (ref 3–12)
Neutro Abs: 7.3 10*3/uL (ref 1.7–7.7)
Neutrophils Relative %: 86 % — ABNORMAL HIGH (ref 43–77)
Platelets: 157 10*3/uL (ref 150–400)
RBC: 4.68 MIL/uL (ref 3.87–5.11)
RDW: 14.8 % (ref 11.5–15.5)
WBC: 8.5 10*3/uL (ref 4.0–10.5)

## 2014-08-24 LAB — BASIC METABOLIC PANEL
Anion gap: 11 (ref 5–15)
BUN: 20 mg/dL (ref 6–20)
CO2: 19 mmol/L — ABNORMAL LOW (ref 22–32)
Calcium: 9.3 mg/dL (ref 8.9–10.3)
Chloride: 107 mmol/L (ref 101–111)
Creatinine, Ser: 0.86 mg/dL (ref 0.44–1.00)
GFR calc Af Amer: >60 mL/min (ref >60)
GFR calc non Af Amer: >60 mL/min (ref >60)
Glucose, Bld: 251 mg/dL — ABNORMAL HIGH (ref 65–99)
Potassium: 3.9 mmol/L (ref 3.5–5.1)
Sodium: 137 mmol/L (ref 135–145)

## 2014-08-24 LAB — URINALYSIS, ROUTINE W REFLEX MICROSCOPIC
Bilirubin Urine: NEGATIVE
Glucose, UA: 500 mg/dL — AB
Hgb urine dipstick: NEGATIVE
Ketones, ur: NEGATIVE mg/dL
Nitrite: NEGATIVE
Protein, ur: NEGATIVE mg/dL
Specific Gravity, Urine: 1.031 — ABNORMAL HIGH (ref 1.005–1.030)
Urobilinogen, UA: 0.2 mg/dL (ref 0.0–1.0)
pH: 5 (ref 5.0–8.0)

## 2014-08-24 LAB — URINE MICROSCOPIC-ADD ON

## 2014-08-24 LAB — PROTIME-INR
INR: 1.14 (ref 0.00–1.49)
Prothrombin Time: 14.8 seconds (ref 11.6–15.2)

## 2014-08-24 LAB — TSH: TSH: 1.412 u[IU]/mL (ref 0.350–4.500)

## 2014-08-24 MED ORDER — OXYCODONE-ACETAMINOPHEN 5-325 MG PO TABS: 1.0000 | ORAL_TABLET | ORAL | Status: DC | PRN

## 2014-08-24 MED ORDER — ACETAMINOPHEN 500 MG PO TABS: 1000.0000 mg | ORAL_TABLET | Freq: Three times a day (TID) | ORAL | Status: DC | PRN

## 2014-08-24 MED ORDER — ALLOPURINOL 100 MG PO TABS
200.0000 mg | ORAL_TABLET | Freq: Every day | ORAL | Status: DC
Start: 2014-08-25 — End: 2014-08-25
  Administered 2014-08-25: 200 mg via ORAL
  Filled 2014-08-24: qty 2

## 2014-08-24 MED ORDER — OXYCODONE-ACETAMINOPHEN 5-325 MG PO TABS
1.0000 | ORAL_TABLET | Freq: Once | ORAL | Status: AC
  Administered 2014-08-24: 1 via ORAL
  Filled 2014-08-24: qty 1

## 2014-08-24 MED ORDER — SODIUM CHLORIDE 0.9 % IJ SOLN
3.0000 mL | Freq: Two times a day (BID) | INTRAMUSCULAR | Status: DC
Start: 2014-08-24 — End: 2014-08-25

## 2014-08-24 MED ORDER — SODIUM CHLORIDE 0.9 % IJ SOLN: 3.0000 mL | INTRAMUSCULAR | Status: DC | PRN

## 2014-08-24 MED ORDER — SODIUM CHLORIDE 0.9 % IJ SOLN
3.0000 mL | Freq: Two times a day (BID) | INTRAMUSCULAR | Status: DC
  Administered 2014-08-24: 3 mL via INTRAVENOUS

## 2014-08-24 MED ORDER — ALUM & MAG HYDROXIDE-SIMETH 200-200-20 MG/5ML PO SUSP: 30.0000 mL | Freq: Four times a day (QID) | ORAL | Status: DC | PRN

## 2014-08-24 MED ORDER — NIFEDIPINE ER 30 MG PO TB24
30.0000 mg | ORAL_TABLET | Freq: Every day | ORAL | Status: DC
  Administered 2014-08-25: 30 mg via ORAL
  Filled 2014-08-24: qty 1

## 2014-08-24 MED ORDER — FLUTICASONE PROPIONATE 50 MCG/ACT NA SUSP: 1.0000 | Freq: Every day | NASAL | Status: DC | PRN

## 2014-08-24 MED ORDER — LOSARTAN POTASSIUM 50 MG PO TABS
25.0000 mg | ORAL_TABLET | Freq: Every day | ORAL | Status: DC
  Administered 2014-08-25: 25 mg via ORAL
  Filled 2014-08-24: qty 1

## 2014-08-24 MED ORDER — MORPHINE SULFATE 2 MG/ML IJ SOLN: 2.0000 mg | INTRAMUSCULAR | Status: DC | PRN

## 2014-08-24 MED ORDER — SODIUM CHLORIDE 0.9 % IV BOLUS (SEPSIS)
500.0000 mL | Freq: Once | INTRAVENOUS | Status: AC
  Administered 2014-08-24: 500 mL via INTRAVENOUS

## 2014-08-24 MED ORDER — SODIUM CHLORIDE 0.9 % IV SOLN: 250.0000 mL | INTRAVENOUS | Status: DC | PRN

## 2014-08-24 MED ORDER — POLYETHYLENE GLYCOL 3350 17 G PO PACK: 17.0000 g | PACK | Freq: Every day | ORAL | Status: DC | PRN

## 2014-08-24 MED ORDER — OMEGA-3-ACID ETHYL ESTERS 1 G PO CAPS
1.0000 g | ORAL_CAPSULE | Freq: Every day | ORAL | Status: DC
  Administered 2014-08-25: 1 g via ORAL
  Filled 2014-08-24: qty 1

## 2014-08-24 MED ORDER — ONDANSETRON HCL 4 MG PO TABS: 4.0000 mg | ORAL_TABLET | Freq: Four times a day (QID) | ORAL | Status: DC | PRN

## 2014-08-24 MED ORDER — ONDANSETRON HCL 4 MG/2ML IJ SOLN
4.0000 mg | Freq: Four times a day (QID) | INTRAMUSCULAR | Status: DC | PRN
  Administered 2014-08-24: 4 mg via INTRAVENOUS
  Filled 2014-08-24: qty 2

## 2014-08-24 MED ORDER — ENOXAPARIN SODIUM 40 MG/0.4ML ~~LOC~~ SOLN
40.0000 mg | SUBCUTANEOUS | Status: DC
Start: 2014-08-24 — End: 2014-08-25
  Administered 2014-08-24: 40 mg via SUBCUTANEOUS
  Filled 2014-08-24: qty 0.4

## 2014-08-24 MED ORDER — ATORVASTATIN CALCIUM 40 MG PO TABS
40.0000 mg | ORAL_TABLET | Freq: Every day | ORAL | Status: DC
  Administered 2014-08-25: 40 mg via ORAL
  Filled 2014-08-24: qty 1

## 2014-08-24 MED ORDER — SPIRONOLACTONE 25 MG PO TABS
25.0000 mg | ORAL_TABLET | Freq: Every day | ORAL | Status: DC
  Administered 2014-08-25: 25 mg via ORAL
  Filled 2014-08-24: qty 1

## 2014-08-24 MED ORDER — HYDROCODONE-ACETAMINOPHEN 5-325 MG PO TABS
1.0000 | ORAL_TABLET | ORAL | Status: DC | PRN
  Administered 2014-08-24 – 2014-08-25 (×6): 1 via ORAL
  Filled 2014-08-24 (×3): qty 1
  Filled 2014-08-24: qty 2
  Filled 2014-08-24 (×2): qty 1

## 2014-08-24 MED ORDER — CARVEDILOL 12.5 MG PO TABS
12.5000 mg | ORAL_TABLET | Freq: Every day | ORAL | Status: DC
  Administered 2014-08-25: 12.5 mg via ORAL
  Filled 2014-08-24: qty 1

## 2014-08-24 MED ORDER — ONDANSETRON HCL 4 MG/2ML IJ SOLN
4.0000 mg | Freq: Once | INTRAMUSCULAR | Status: DC
  Filled 2014-08-24: qty 2

## 2014-08-24 MED ORDER — OXYCODONE HCL 5 MG PO TABS
5.0000 mg | ORAL_TABLET | ORAL | Status: DC | PRN
  Administered 2014-08-24: 5 mg via ORAL
  Filled 2014-08-24: qty 1

## 2014-08-24 MED ORDER — CLOPIDOGREL BISULFATE 75 MG PO TABS
75.0000 mg | ORAL_TABLET | Freq: Every day | ORAL | Status: DC
  Administered 2014-08-25: 75 mg via ORAL
  Filled 2014-08-24: qty 1

## 2014-08-24 NOTE — Discharge Instructions (Signed)
°  Please return immediately if you have any symptoms of feeling lightheaded. Please return with any additional chest pain not controlled with pain medications. Please return with any cough or fever or symptoms of pneumonia.

## 2014-08-24 NOTE — H&P (Signed)
[facility] History and Physical  Brandi Beck PYK:[id number] DOB: 04/18/1928 DOA: 08/24/2014  Referring physician:  PCP: Velna Hatchet, MD   Chief Complaint: dizziness  HPI: Brandi Beck is a 81 y.o. female past medical history of essential hypertension and dyslipidemia currently on Plavix that comes in for dizziness , she relates she was walking to the bathroom around 10 PM which felt dizzy and fell. She says she hit the right side of her chest with the toilet. She was able to get up and walk fine. She felt like she was certainly could've passed out but she did not. She relates she did not loss consciousness, she did not hit her head. She relates no nausea, vomiting, diarrhea, chest pain, shortness of breath, palpitation, weakness, change in vision or new medications. She denies any change is in her medication. No loss of sphincter control, not post ictal. Her husband is at bedside and is helping with history.   In the ED: A basic metabolic panel was done that shows a bicarbonate of 19, CBC showed no leukocytosis or left shift, blood glucose is 250, right extremity show multiple rib fractures   Review of Systems:  Constitutional:  No weight loss, night sweats, Fevers, chills, fatigue.  HEENT:  No headaches, Difficulty swallowing,Tooth/dental problems,Sore throat,  No sneezing, itching, ear ache, nasal congestion, post nasal drip,  Cardio-vascular:  No chest pain, Orthopnea, PND, swelling in lower extremities, anasarca, dizziness, palpitations  GI:  No heartburn, indigestion, abdominal pain, nausea, vomiting, diarrhea, change in bowel habits, loss of appetite  Resp:  No shortness of breath with exertion or at rest. No excess mucus, no productive cough, No non-productive cough, No coughing up of blood.No change in color of mucus.No wheezing.No chest wall deformity  Skin:  no rash or lesions.  GU:  no dysuria, change in color of urine, no urgency or frequency. No flank pain.    Musculoskeletal:  No joint pain or swelling. No decreased range of motion. No back pain.  Psych:  No change in mood or affect. No depression or anxiety. No memory loss.   Past Medical History  Diagnosis Date  . Diabetes mellitus without complication   . Hypertension   . Gout   . High cholesterol    History reviewed. No pertinent past surgical history. Social History:  reports that she has never smoked. She does not have any smokeless tobacco history on file. She reports that she does not drink alcohol. Her drug history is not on file.  Allergies  Allergen Reactions  . Citrus Swelling and Cough    Swollen lips  . Peanuts [Peanut Oil] Swelling and Cough    Swollen lips  . Aspirin Rash  . Niacin And Related Rash    Family History  Problem Relation Age of Onset  . Kidney cancer Mother     Prior to Admission medications   Medication Sig Start Date End Date Taking? Authorizing Provider  acetaminophen (TYLENOL) 500 MG tablet Take 1,000 mg by mouth every 8 (eight) hours as needed for mild pain or moderate pain.   Yes Historical Provider, MD  allopurinol (ZYLOPRIM) 100 MG tablet Take 200 mg by mouth daily.   Yes Historical Provider, MD  atorvastatin (LIPITOR) 40 MG tablet Take 40 mg by mouth daily.   Yes Historical Provider, MD  carvedilol (COREG) 12.5 MG tablet Take 12.5 mg by mouth daily.   Yes Historical Provider, MD  clobetasol ointment (TEMOVATE) 5.39 % Apply 1 application topically daily as needed (sclerosis).  Yes Historical Provider, MD  clopidogrel (PLAVIX) 75 MG tablet Take 75 mg by mouth daily.   Yes Historical Provider, MD  fluocinonide cream (LIDEX) 1.61 % Apply 1 application topically daily as needed (rash).   Yes Historical Provider, MD  fluticasone (FLONASE) 50 MCG/ACT nasal spray Place 1 spray into both nostrils daily as needed for allergies or rhinitis.   Yes Historical Provider, MD  losartan (COZAAR) 25 MG tablet Take 25 mg by mouth daily.   Yes Historical  Provider, MD  metFORMIN (GLUCOPHAGE) 500 MG tablet Take 500 mg by mouth 2 (two) times daily with a meal.   Yes Historical Provider, MD  NIFEdipine (PROCARDIA-XL/ADALAT-CC/NIFEDICAL-XL) 30 MG 24 hr tablet Take 30 mg by mouth daily.   Yes Historical Provider, MD  Omega-3 Fatty Acids (FISH OIL PO) Take 1 capsule by mouth daily.   Yes Historical Provider, MD  PRESCRIPTION MEDICATION Place 1 application vaginally daily. Testosterone 2% in VCB Compound. Use vaginally daily.   Yes Historical Provider, MD  spironolactone (ALDACTONE) 25 MG tablet Take 25 mg by mouth daily.   Yes Historical Provider, MD  oxyCODONE-acetaminophen (PERCOCET/ROXICET) 5-325 MG per tablet Take 1-2 tablets by mouth every 4 (four) hours as needed for severe pain. 08/24/14   Courteney Lyn Mackuen, MD   Physical Exam: Filed Vitals:   08/24/14 0708 08/24/14 0915 08/24/14 1210 08/24/14 1233  BP: 149/59 106/55 110/56 159/85  Pulse: 118 74 72 92  Temp: 98.3 F (36.8 C) 97.5 F (36.4 C)  98.7 F (37.1 C)  TempSrc: Oral Oral  Oral  Resp: 20 16 16 18   Height: 5\' 1"  (1.549 m)   5\' 1"  (1.549 m)  Weight: 82.101 kg (181 lb)   84.5 kg (186 lb 4.6 oz)  SpO2: 100% 92% 100% 94%    Wt Readings from Last 3 Encounters:  08/24/14 84.5 kg (186 lb 4.6 oz)    General:  Appears calm and comfortable Eyes: PERRL, normal lids, irises & conjunctiva ENT: grossly normal hearing, lips & tongue Neck: no LAD, masses or thyromegaly Cardiovascular: RRR, no m/r/g. No LE edema. Telemetry: SR, no arrhythmias  Respiratory: CTA bilaterally, no w/r/r.tender chest wall on the right Abdomen: soft, ntnd Skin: no rash or induration seen on limited exam Musculoskeletal: grossly normal tone BUE/BLE Psychiatric: grossly normal mood and affect, speech fluent and appropriate Neurologic: grossly non-focal.          Labs on Admission:  Basic Metabolic Panel:  Recent Labs Lab 08/24/14 0746  NA 137  K 3.9  CL 107  CO2 19*  GLUCOSE 251*  BUN 20    CREATININE 0.86  CALCIUM 9.3   Liver Function Tests: No results for input(s): AST, ALT, ALKPHOS, BILITOT, PROT, ALBUMIN in the last 168 hours. No results for input(s): LIPASE, AMYLASE in the last 168 hours. No results for input(s): AMMONIA in the last 168 hours. CBC:  Recent Labs Lab 08/24/14 0746  WBC 8.5  NEUTROABS 7.3  HGB 14.3  HCT 43.6  MCV 93.2  PLT 157   Cardiac Enzymes:  Recent Labs Lab 08/24/14 0746  TROPONINI <0.03    BNP (last 3 results) No results for input(s): BNP in the last 8760 hours.  ProBNP (last 3 results) No results for input(s): PROBNP in the last 8760 hours.  CBG: No results for input(s): GLUCAP in the last 168 hours.  Radiological Exams on Admission: Dg Chest 2 View  08/24/2014   CLINICAL DATA:  Fall last night  EXAM: CHEST  2 VIEW  COMPARISON:  None.  FINDINGS: The heart is mildly enlarged. Vascular congestion without interstitial edema is present. There is a large hiatal hernia with an air-fluid level. There is associated atelectasis versus consolidation at the left base. Minimal hypoaeration change at the right base. Degenerative changes in the thoracic spine without obvious compression deformity. Osteopenia. No pleural effusion. No obvious acute bony injury. No pneumothorax.  IMPRESSION: Cardiomegaly and vascular congestion without edema.  Left basilar atelectasis versus consolidation associated with a large hiatal hernia.   Electronically Signed   By: Marybelle Killings M.D.   On: 08/24/2014 08:43   Dg Ribs Unilateral Right  08/24/2014   CLINICAL DATA:  Fall.  Pain  EXAM: RIGHT RIBS - 2 VIEW  COMPARISON:  08/24/2014  FINDINGS: Possible nondisplaced fracture right anterior sixth rib.  Right lower lobe atelectasis. No effusion or pneumothorax on the right.  IMPRESSION: Possible nondisplaced fracture right anterior sixth rib  Right lower lobe atelectasis.   Electronically Signed   By: Franchot Gallo M.D.   On: 08/24/2014 08:35   Ct Head Wo  Contrast  08/24/2014   CLINICAL DATA:  81 year old diabetic hypertensive female with hyperlipidemia felt dizzy last evening and fell. Does not remember if struck head. Prior cervical fusion 2001. Initial encounter.  EXAM: CT HEAD WITHOUT CONTRAST  CT CERVICAL SPINE WITHOUT CONTRAST  TECHNIQUE: Multidetector CT imaging of the head and cervical spine was performed following the standard protocol without intravenous contrast. Multiplanar CT image reconstructions of the cervical spine were also generated.  COMPARISON:  None.  FINDINGS: CT HEAD FINDINGS  No skull fracture or intracranial hemorrhage.  Small vessel disease type changes most notable right parietal lobe. Remote infarct right basal ganglia. No CT evidence of large acute infarct.  No intracranial mass lesion noted on this unenhanced exam.  No hydrocephalus.  Vascular calcifications.  Mastoid air cells, middle ear cavities and visualized paranasal sinuses are clear  CT CERVICAL SPINE FINDINGS  No cervical spine fracture or malalignment.  Prior fusion C5-C7.  Prominent transverse ligament hypertrophy.  Cervical spondylotic changes most prominent C3-4 and C4-5 with various degrees of spinal stenosis and foraminal narrowing. If ligamentous injury or cord injury were of high clinical concern, MR imaging could be obtained for further delineation.  Heterogeneous thyroid gland with calcifications incompletely assessed on present exam. Thyroid ultrasound can be obtained for further delineation.  Vascular calcifications.  IMPRESSION: Head CT:  No skull fracture or intracranial hemorrhage.  Small vessel disease type changes most prominent right parietal lobe with remote right basal ganglia infarct. No CT evidence of large acute infarct.  Cervical spine CT:  No cervical spine fracture or malalignment.  Prior fusion C5-C7.  Prominent transverse ligament hypertrophy.  Cervical spondylotic changes most prominent C3-4 and C4-5 with various degrees of spinal stenosis and  foraminal narrowing.   Electronically Signed   By: Genia Del M.D.   On: 08/24/2014 08:36   Ct Cervical Spine Wo Contrast  08/24/2014   CLINICAL DATA:  81 year old diabetic hypertensive female with hyperlipidemia felt dizzy last evening and fell. Does not remember if struck head. Prior cervical fusion 2001. Initial encounter.  EXAM: CT HEAD WITHOUT CONTRAST  CT CERVICAL SPINE WITHOUT CONTRAST  TECHNIQUE: Multidetector CT imaging of the head and cervical spine was performed following the standard protocol without intravenous contrast. Multiplanar CT image reconstructions of the cervical spine were also generated.  COMPARISON:  None.  FINDINGS: CT HEAD FINDINGS  No skull fracture or intracranial hemorrhage.  Small vessel disease type  changes most notable right parietal lobe. Remote infarct right basal ganglia. No CT evidence of large acute infarct.  No intracranial mass lesion noted on this unenhanced exam.  No hydrocephalus.  Vascular calcifications.  Mastoid air cells, middle ear cavities and visualized paranasal sinuses are clear  CT CERVICAL SPINE FINDINGS  No cervical spine fracture or malalignment.  Prior fusion C5-C7.  Prominent transverse ligament hypertrophy.  Cervical spondylotic changes most prominent C3-4 and C4-5 with various degrees of spinal stenosis and foraminal narrowing. If ligamentous injury or cord injury were of high clinical concern, MR imaging could be obtained for further delineation.  Heterogeneous thyroid gland with calcifications incompletely assessed on present exam. Thyroid ultrasound can be obtained for further delineation.  Vascular calcifications.  IMPRESSION: Head CT:  No skull fracture or intracranial hemorrhage.  Small vessel disease type changes most prominent right parietal lobe with remote right basal ganglia infarct. No CT evidence of large acute infarct.  Cervical spine CT:  No cervical spine fracture or malalignment.  Prior fusion C5-C7.  Prominent transverse ligament  hypertrophy.  Cervical spondylotic changes most prominent C3-4 and C4-5 with various degrees of spinal stenosis and foraminal narrowing.   Electronically Signed   By: Genia Del M.D.   On: 08/24/2014 08:36    EKG: Independently reviewed. Sinus tachycardia with nonspecific T-wave changes.  Assessment/Plan Near syncope: - Admit to telemetry, cycle her cardiac enzymes 3. Get a 2-D echo , check a TSH and hemoglobin A1c. - She is nonfocal on physical exam, CT scan of the head does not show any acute changes, it's unlikely a neurological event. - She relates some dizziness before the episode, there has been no changes in her medication or new medications. - seems to be vasovagal.  Essential hypertension: - I will continue her home dose regimen.  Dyslipidemia: - Continue statins.  Fracture of rib of right side: - Incentive spirometry narcotics for pain.  Controlled diabetes mellitus: I will hold her metformin started on his Lantus sliding scale insulin.  Code Status: full DVT Prophylaxis:heparin Family Communication: husband Disposition Plan: observation  Time spent: 71 min  FELIZ Marguarite Arbour [facility] Pager [phone]

## 2014-08-24 NOTE — ED Notes (Signed)
Pt fell in the bathroom last night when she became dizzy and hit the right side of her ribs and back of her arm on the toilet

## 2014-08-24 NOTE — ED Provider Notes (Addendum)
CSN: [id number]     Arrival date & time 08/24/14  8786 History   First MD Initiated Contact with Patient 08/24/14 [phone]     Chief Complaint  Patient presents with  . Fall     (Consider location/radiation/quality/duration/timing/severity/associated sxs/prior Treatment) HPI   Patient is a very pleasant 81 year old female who got up last night at 10 PM and went to the bathroom, felt dizzy, and fell.She reports she felt fine walking to the bathroom and then all of a sudden felt she was going to pass out. Patient does not remeber whether she struck her head or not. She did strike the side of her chest wall against the toilet. Her husband came up and found her and helped her to bed. She is unable to sleep secondary to pain.. Patient ambulatory prior to arrival.  Past Medical History  Diagnosis Date  . Diabetes mellitus without complication   . Hypertension   . Gout   . High cholesterol    History reviewed. No pertinent past surgical history. History reviewed. No pertinent family history. History  Substance Use Topics  . Smoking status: Never Smoker   . Smokeless tobacco: Not on file  . Alcohol Use: No   OB History    No data available     Review of Systems  Constitutional: Negative for activity change and fatigue.  HENT: Negative for congestion and drooling.   Eyes: Negative for discharge.  Respiratory: Positive for chest tightness. Negative for cough.   Cardiovascular: Positive for chest pain. Negative for palpitations.  Gastrointestinal: Negative for abdominal distention.  Genitourinary: Negative for dysuria and difficulty urinating.  Musculoskeletal: Negative for joint swelling.  Skin: Negative for rash.  Allergic/Immunologic: Negative for immunocompromised state.  Neurological: Negative for seizures and speech difficulty.  Psychiatric/Behavioral: Negative for behavioral problems and agitation.  All other systems reviewed and are negative.     Allergies  Citrus;  Peanuts; Aspirin; and Niacin and related  Home Medications   Prior to Admission medications   Medication Sig Start Date End Date Taking? Authorizing Provider  acetaminophen (TYLENOL) 500 MG tablet Take 1,000 mg by mouth every 8 (eight) hours as needed for mild pain or moderate pain.   Yes Historical Provider, MD  allopurinol (ZYLOPRIM) 100 MG tablet Take 200 mg by mouth daily.   Yes Historical Provider, MD  atorvastatin (LIPITOR) 40 MG tablet Take 40 mg by mouth daily.   Yes Historical Provider, MD  carvedilol (COREG) 12.5 MG tablet Take 12.5 mg by mouth daily.   Yes Historical Provider, MD  clobetasol ointment (TEMOVATE) 0.94 % Apply 1 application topically daily as needed (sclerosis).   Yes Historical Provider, MD  clopidogrel (PLAVIX) 75 MG tablet Take 75 mg by mouth daily.   Yes Historical Provider, MD  fluocinonide cream (LIDEX) 7.09 % Apply 1 application topically daily as needed (rash).   Yes Historical Provider, MD  fluticasone (FLONASE) 50 MCG/ACT nasal spray Place 1 spray into both nostrils daily as needed for allergies or rhinitis.   Yes Historical Provider, MD  losartan (COZAAR) 25 MG tablet Take 25 mg by mouth daily.   Yes Historical Provider, MD  metFORMIN (GLUCOPHAGE) 500 MG tablet Take 500 mg by mouth 2 (two) times daily with a meal.   Yes Historical Provider, MD  NIFEdipine (PROCARDIA-XL/ADALAT-CC/NIFEDICAL-XL) 30 MG 24 hr tablet Take 30 mg by mouth daily.   Yes Historical Provider, MD  Omega-3 Fatty Acids (FISH OIL PO) Take 1 capsule by mouth daily.   Yes Historical Provider,  MD  PRESCRIPTION MEDICATION Place 1 application vaginally daily. Testosterone 2% in VCB Compound. Use vaginally daily.   Yes Historical Provider, MD  spironolactone (ALDACTONE) 25 MG tablet Take 25 mg by mouth daily.   Yes Historical Provider, MD   BP 106/55 mmHg  Pulse 74  Temp(Src) 97.5 F (36.4 C) (Oral)  Resp 16  Ht 5\' 1"  (1.549 m)  Wt 181 lb (82.101 kg)  BMI 34.22 kg/m2  SpO2 92% Physical  Exam  Constitutional: She is oriented to person, place, and time. She appears well-developed and well-nourished.  HENT:  Head: Normocephalic and atraumatic.  Eyes: Conjunctivae are normal. Right eye exhibits no discharge.  Neck: Neck supple.  Cardiovascular: Normal rate, regular rhythm and normal heart sounds.   No murmur heard. Pulmonary/Chest: Effort normal and breath sounds normal. She has no wheezes. She has no rales.  Presenting to the posterior lateral chest wall. Tenderness to touch  Abdominal: Soft. She exhibits no distension. There is no tenderness.  Musculoskeletal: Normal range of motion. She exhibits no edema.  Scarring to bilateral knees. No pain with bilateral hip manipulation. Normal range of motion.  Neurological: She is oriented to person, place, and time. No cranial nerve deficit.  Skin: Skin is warm and dry. No rash noted. She is not diaphoretic.  Psychiatric: She has a normal mood and affect. Her behavior is normal.  Nursing note and vitals reviewed.   ED Course  Procedures (including critical care time) Labs Review Labs Reviewed  BASIC METABOLIC PANEL - Abnormal; Notable for the following:    CO2 19 (*)    Glucose, Bld 251 (*)    All other components within normal limits  CBC WITH DIFFERENTIAL/PLATELET - Abnormal; Notable for the following:    Neutrophils Relative % 86 (*)    Lymphocytes Relative 8 (*)    All other components within normal limits  URINALYSIS, ROUTINE W REFLEX MICROSCOPIC (NOT AT [facility]) - Abnormal; Notable for the following:    Specific Gravity, Urine 1.031 (*)    Glucose, UA 500 (*)    Leukocytes, UA TRACE (*)    All other components within normal limits  URINE MICROSCOPIC-ADD ON - Abnormal; Notable for the following:    Squamous Epithelial / LPF FEW (*)    Bacteria, UA FEW (*)    All other components within normal limits  TROPONIN I  PROTIME-INR    Imaging Review Dg Chest 2 View  08/24/2014   CLINICAL DATA:  Fall last night  EXAM:  CHEST  2 VIEW  COMPARISON:  None.  FINDINGS: The heart is mildly enlarged. Vascular congestion without interstitial edema is present. There is a large hiatal hernia with an air-fluid level. There is associated atelectasis versus consolidation at the left base. Minimal hypoaeration change at the right base. Degenerative changes in the thoracic spine without obvious compression deformity. Osteopenia. No pleural effusion. No obvious acute bony injury. No pneumothorax.  IMPRESSION: Cardiomegaly and vascular congestion without edema.  Left basilar atelectasis versus consolidation associated with a large hiatal hernia.   Electronically Signed   By: Marybelle Killings M.D.   On: 08/24/2014 08:43   Dg Ribs Unilateral Right  08/24/2014   CLINICAL DATA:  Fall.  Pain  EXAM: RIGHT RIBS - 2 VIEW  COMPARISON:  08/24/2014  FINDINGS: Possible nondisplaced fracture right anterior sixth rib.  Right lower lobe atelectasis. No effusion or pneumothorax on the right.  IMPRESSION: Possible nondisplaced fracture right anterior sixth rib  Right lower lobe atelectasis.   Electronically  Signed   By: Franchot Gallo M.D.   On: 08/24/2014 08:35   Ct Head Wo Contrast  08/24/2014   CLINICAL DATA:  81 year old diabetic hypertensive female with hyperlipidemia felt dizzy last evening and fell. Does not remember if struck head. Prior cervical fusion 2001. Initial encounter.  EXAM: CT HEAD WITHOUT CONTRAST  CT CERVICAL SPINE WITHOUT CONTRAST  TECHNIQUE: Multidetector CT imaging of the head and cervical spine was performed following the standard protocol without intravenous contrast. Multiplanar CT image reconstructions of the cervical spine were also generated.  COMPARISON:  None.  FINDINGS: CT HEAD FINDINGS  No skull fracture or intracranial hemorrhage.  Small vessel disease type changes most notable right parietal lobe. Remote infarct right basal ganglia. No CT evidence of large acute infarct.  No intracranial mass lesion noted on this unenhanced exam.   No hydrocephalus.  Vascular calcifications.  Mastoid air cells, middle ear cavities and visualized paranasal sinuses are clear  CT CERVICAL SPINE FINDINGS  No cervical spine fracture or malalignment.  Prior fusion C5-C7.  Prominent transverse ligament hypertrophy.  Cervical spondylotic changes most prominent C3-4 and C4-5 with various degrees of spinal stenosis and foraminal narrowing. If ligamentous injury or cord injury were of high clinical concern, MR imaging could be obtained for further delineation.  Heterogeneous thyroid gland with calcifications incompletely assessed on present exam. Thyroid ultrasound can be obtained for further delineation.  Vascular calcifications.  IMPRESSION: Head CT:  No skull fracture or intracranial hemorrhage.  Small vessel disease type changes most prominent right parietal lobe with remote right basal ganglia infarct. No CT evidence of large acute infarct.  Cervical spine CT:  No cervical spine fracture or malalignment.  Prior fusion C5-C7.  Prominent transverse ligament hypertrophy.  Cervical spondylotic changes most prominent C3-4 and C4-5 with various degrees of spinal stenosis and foraminal narrowing.   Electronically Signed   By: Genia Del M.D.   On: 08/24/2014 08:36   Ct Cervical Spine Wo Contrast  08/24/2014   CLINICAL DATA:  81 year old diabetic hypertensive female with hyperlipidemia felt dizzy last evening and fell. Does not remember if struck head. Prior cervical fusion 2001. Initial encounter.  EXAM: CT HEAD WITHOUT CONTRAST  CT CERVICAL SPINE WITHOUT CONTRAST  TECHNIQUE: Multidetector CT imaging of the head and cervical spine was performed following the standard protocol without intravenous contrast. Multiplanar CT image reconstructions of the cervical spine were also generated.  COMPARISON:  None.  FINDINGS: CT HEAD FINDINGS  No skull fracture or intracranial hemorrhage.  Small vessel disease type changes most notable right parietal lobe. Remote infarct right  basal ganglia. No CT evidence of large acute infarct.  No intracranial mass lesion noted on this unenhanced exam.  No hydrocephalus.  Vascular calcifications.  Mastoid air cells, middle ear cavities and visualized paranasal sinuses are clear  CT CERVICAL SPINE FINDINGS  No cervical spine fracture or malalignment.  Prior fusion C5-C7.  Prominent transverse ligament hypertrophy.  Cervical spondylotic changes most prominent C3-4 and C4-5 with various degrees of spinal stenosis and foraminal narrowing. If ligamentous injury or cord injury were of high clinical concern, MR imaging could be obtained for further delineation.  Heterogeneous thyroid gland with calcifications incompletely assessed on present exam. Thyroid ultrasound can be obtained for further delineation.  Vascular calcifications.  IMPRESSION: Head CT:  No skull fracture or intracranial hemorrhage.  Small vessel disease type changes most prominent right parietal lobe with remote right basal ganglia infarct. No CT evidence of large acute infarct.  Cervical spine CT:  No cervical spine fracture or malalignment.  Prior fusion C5-C7.  Prominent transverse ligament hypertrophy.  Cervical spondylotic changes most prominent C3-4 and C4-5 with various degrees of spinal stenosis and foraminal narrowing.   Electronically Signed   By: Genia Del M.D.   On: 08/24/2014 08:36     EKG Interpretation   Date/Time:  Tuesday August 24 2014 07:07:37 EDT Ventricular Rate:  109 PR Interval:  180 QRS Duration: 74 QT Interval:  334 QTC Calculation: 450 R Axis:   -34 Text Interpretation:  Sinus tachycardia Left axis deviation Anterior  infarct, old Baseline wander in lead(s) V4 no acute ischemia. baseline  wandering. Confirmed by Gerald Leitz ([id number]) on 08/24/2014 7:42:14 AM      MDM   Final diagnoses:  Fall   patient is a very pleasant 81 year old female who fell last night. She complains mostly of rib pain to her posterior lateral right hand side.  Concern for 2 rib fractures. We'll get chest x-ray and rib films. We'll also get CT head and neck since patient does not remember if she struck her head or not. Patient on arrival is tachycardic and having trouble taking deep breaths secondary to pain.  Patient has negative troponin 1 suffices since this event happened last night at 10 PM.  Patient centered discussion had with patient and husband about the utility of admission. We've been watching her on telemetry for the last 4 hours with no events. We weighed the risks and benefits of admission for observation on telemetry versus discharge.       She has been incentrive spirometering around 750 which is ideal for her age.  She will call when she gets home to follow up with her PCP this week.  Patient showed the ability to take deep breaths and use this and t incentive spirometer multiple times every hour.  Will admit for syncope.   Lawson Isabell Julio Alm, MD 08/24/14 [place], MD 08/24/14 [id number]

## 2014-08-24 NOTE — Progress Notes (Signed)
[facility] ED CM noted pt admission. CM spoke with EDP, [place] and then Dr Aileen Fass who both agree on admission status as observation but entered incorrectly Verification order given by Dr Aileen Fass

## 2014-08-24 NOTE — ED Notes (Signed)
Pt can go at 11:35

## 2014-08-25 ENCOUNTER — Observation Stay (HOSPITAL_COMMUNITY): Payer: Medicare Other

## 2014-08-25 DIAGNOSIS — E119 Type 2 diabetes mellitus without complications: Secondary | ICD-10-CM | POA: Diagnosis not present

## 2014-08-25 DIAGNOSIS — E785 Hyperlipidemia, unspecified: Secondary | ICD-10-CM

## 2014-08-25 DIAGNOSIS — R55 Syncope and collapse: Secondary | ICD-10-CM

## 2014-08-25 DIAGNOSIS — S2231XA Fracture of one rib, right side, initial encounter for closed fracture: Secondary | ICD-10-CM | POA: Diagnosis not present

## 2014-08-25 DIAGNOSIS — I10 Essential (primary) hypertension: Secondary | ICD-10-CM | POA: Diagnosis not present

## 2014-08-25 DIAGNOSIS — R42 Dizziness and giddiness: Secondary | ICD-10-CM | POA: Diagnosis not present

## 2014-08-25 LAB — COMPREHENSIVE METABOLIC PANEL
ALT: 16 U/L (ref 14–54)
AST: 15 U/L (ref 15–41)
Albumin: 3.9 g/dL (ref 3.5–5.0)
Alkaline Phosphatase: 80 U/L (ref 38–126)
Anion gap: 5 (ref 5–15)
BUN: 16 mg/dL (ref 6–20)
CO2: 23 mmol/L (ref 22–32)
Calcium: 9.2 mg/dL (ref 8.9–10.3)
Chloride: 108 mmol/L (ref 101–111)
Creatinine, Ser: 0.7 mg/dL (ref 0.44–1.00)
GFR calc Af Amer: >60 mL/min (ref >60)
GFR calc non Af Amer: >60 mL/min (ref >60)
Glucose, Bld: 141 mg/dL — ABNORMAL HIGH (ref 65–99)
Potassium: 4.4 mmol/L (ref 3.5–5.1)
Sodium: 136 mmol/L (ref 135–145)
Total Bilirubin: 0.8 mg/dL (ref 0.3–1.2)
Total Protein: 6.2 g/dL — ABNORMAL LOW (ref 6.5–8.1)

## 2014-08-25 LAB — CBC
HCT: 41.5 % (ref 36.0–46.0)
Hemoglobin: 13.6 g/dL (ref 12.0–15.0)
MCH: 31 pg (ref 26.0–34.0)
MCHC: 32.8 g/dL (ref 30.0–36.0)
MCV: 94.5 fL (ref 78.0–100.0)
Platelets: 167 10*3/uL (ref 150–400)
RBC: 4.39 MIL/uL (ref 3.87–5.11)
RDW: 15 % (ref 11.5–15.5)
WBC: 8.2 10*3/uL (ref 4.0–10.5)

## 2014-08-25 LAB — GLUCOSE, CAPILLARY: Glucose-Capillary: 135 mg/dL — ABNORMAL HIGH (ref 65–99)

## 2014-08-25 LAB — HEMOGLOBIN A1C
Hgb A1c MFr Bld: 7.1 % — ABNORMAL HIGH (ref 4.8–5.6)
Mean Plasma Glucose: 157 mg/dL

## 2014-08-25 LAB — TROPONIN I: Troponin I: <0.03 ng/mL (ref <0.031)

## 2014-08-25 MED ORDER — HYDROCODONE-ACETAMINOPHEN 5-325 MG PO TABS: 1.0000 | ORAL_TABLET | Freq: Four times a day (QID) | ORAL | Status: DC | PRN

## 2014-08-25 NOTE — Evaluation (Signed)
Occupational Therapy Evaluation Patient Details Name: Brandi Beck MRN: [id number] DOB: 12-13-1928 Today's Date: 08/25/2014    History of Present Illness Pt is an 81 year old female admitted after near syncope episode and resulting right-sided rib fracture with hx of DM, HTN, gout   Clinical Impression   Pt was admitted for the above.  Pt needs up to max A for LB adls due to R rib pain; husband feels comfortable assisting her and guarding her when she is walking.  All education was completed.  No further OT needs at this time.      Follow Up Recommendations  Supervision/Assistance - 24 hour    Equipment Recommendations   (RW with 5" wheels--pt reports hers is old and rickety)    Recommendations for Other Services       Precautions / Restrictions Precautions Precautions: Fall Restrictions Weight Bearing Restrictions: No      Mobility Bed Mobility Overal bed mobility: Needs Assistance Bed Mobility: Rolling;Sidelying to Sit Rolling: Min assist Sidelying to sit: Min assist       General bed mobility comments: light assistance to roll over and sit up on L side  Transfers                 Balance                                            ADL Overall ADL's : Needs assistance/impaired Eating/Feeding: Set up   Grooming: Set up;Sitting   Upper Body Bathing: Minimal assitance;Sitting   Lower Body Bathing: Maximal assistance;Sit to/from stand   Upper Body Dressing : Minimal assistance;Sitting   Lower Body Dressing: Maximal assistance;Sit to/from stand                 General ADL Comments: assisted pt to EOB to eat.  She has a Secondary school teacher at home, but husband will assist with adls as needed     Vision     Perception     Praxis      Pertinent Vitals/Pain Pain Assessment: 0-10 Pain Score: 8  (5-8, increased with coughing) Pain Location: R ribs/back of ribs Pain Descriptors / Indicators: Sore Pain Intervention(s): Limited activity  within patient's tolerance;Monitored during session;Premedicated before session;Repositioned     Hand Dominance     Extremity/Trunk Assessment Upper Extremity Assessment Upper Extremity Assessment: RUE deficits/detail RUE Deficits / Details: able to lift to 90; R ribcage sore          Communication Communication Communication: No difficulties   Cognition Arousal/Alertness: Awake/alert Behavior During Therapy: WFL for tasks assessed/performed Overall Cognitive Status: Within Functional Limits for tasks assessed                     General Comments       Exercises       Shoulder Instructions      Home Living Family/patient expects to be discharged to:: Private residence Living Arrangements: Spouse/significant other   Type of Home: House Home Access: Stairs to enter Technical brewer of Steps: 2   Home Layout: One level     Bathroom Shower/Tub: Occupational psychologist: Handicapped height Bathroom Accessibility: No   Home Equipment: Environmental consultant - 2 wheels;Cane - single point;Grab bars - toilet;Grab bars - tub/shower   Additional Comments: pt reports walker is 81 years old and "rickety"  Prior Functioning/Environment Level of Independence: Independent             OT Diagnosis: Acute pain   OT Problem List:     OT Treatment/Interventions:      OT Goals(Current goals can be found in the care plan section) Acute Rehab OT Goals Patient Stated Goal: decreased pain  OT Frequency:     Barriers to D/C:            Co-evaluation              End of Session    Activity Tolerance: Patient limited by pain Patient left:  EOB with call bell and standing bed alarm set  Spoke to NT   Time: 1143-1159 OT Time Calculation (min): 16 min Charges:  OT General Charges $OT Visit: 1 Procedure OT Evaluation $Initial OT Evaluation Tier I: 1 Procedure G-Codes: OT G-codes **NOT FOR INPATIENT CLASS** Functional Assessment Tool Used:  clinical judgment Functional Limitation: Self care Self Care Current Status (U1314): At least 60 percent but less than 80 percent impaired, limited or restricted Self Care Goal Status (H8887): At least 60 percent but less than 80 percent impaired, limited or restricted Self Care Discharge Status [phone]): At least 60 percent but less than 80 percent impaired, limited or restricted  Feliberto Stockley 08/25/2014, 12:43 PM Lesle Chris, OTR/L [phone] 08/25/2014

## 2014-08-25 NOTE — Progress Notes (Signed)
  Echocardiogram 2D Echocardiogram has been performed.  Tresa Res 08/25/2014, 12:41 PM

## 2014-08-25 NOTE — Evaluation (Addendum)
Physical Therapy Evaluation Patient Details Name: Brandi Beck MRN: [id number] DOB: November 03, 1928 Today's Date: 08/25/2014   History of Present Illness  Pt is an 81 year old female admitted after near syncope episode and resulting right-sided rib fracture with hx of DM, HTN, gout  Clinical Impression  Pt admitted with above diagnosis. Pt currently with functional limitations due to the deficits listed below (see PT Problem List).  Pt will benefit from skilled PT to increase their independence and safety with mobility to allow discharge to the venue listed below.  Pt has RW and SPC at home and encouraged pt to use RW upon d/c for improved safety and pain control.     Follow Up Recommendations Home health PT    Equipment Recommendations  None recommended by PT    Recommendations for Other Services       Precautions / Restrictions Precautions Precautions: Fall Restrictions Weight Bearing Restrictions: No      Mobility  Bed Mobility Overal bed mobility: Needs Assistance Bed Mobility: Rolling;Sidelying to Sit Rolling: Min guard Sidelying to sit: Min guard       General bed mobility comments: verbal cues for log roll technique to left side to control R rib pain  Transfers Overall transfer level: Needs assistance Equipment used: None Transfers: Sit to/from Stand Sit to Stand: Min assist         General transfer comment: verbal cues for technique, assist to control descent due to pain, educated pt to hug pillow to R side for pain and use L UE to control ascent/descent  Ambulation/Gait Ambulation/Gait assistance: Min guard Ambulation Distance (Feet): 80 Feet Assistive device: None Gait Pattern/deviations: Step-through pattern;Decreased stride length     General Gait Details: increased lateral sway bilaterally and some shakiness of UE which pt reports as baseline  Financial trader Rankin (Stroke Patients Only)       Balance                                             Pertinent Vitals/Pain Pain Assessment: 0-10 Pain Score: 5  Pain Location: right rib cage Pain Descriptors / Indicators: Sore Pain Intervention(s): Limited activity within patient's tolerance;Monitored during session;Repositioned;Premedicated before session    Home Living Family/patient expects to be discharged to:: Private residence Living Arrangements: Spouse/significant other   Type of Home: House Home Access: Stairs to enter   Technical brewer of Steps: 2 Home Layout: One level Home Equipment: Environmental consultant - 2 wheels;Cane - single point      Prior Function Level of Independence: Independent               Hand Dominance        Extremity/Trunk Assessment               Lower Extremity Assessment: Generalized weakness         Communication   Communication: No difficulties  Cognition Arousal/Alertness: Awake/alert Behavior During Therapy: WFL for tasks assessed/performed Overall Cognitive Status: Within Functional Limits for tasks assessed                      General Comments      Exercises        Assessment/Plan    PT Assessment Patient needs continued PT services  PT Diagnosis Difficulty  walking;Acute pain   PT Problem List Decreased strength;Decreased activity tolerance;Decreased mobility;Pain  PT Treatment Interventions DME instruction;Gait training;Functional mobility training;Patient/family education;Therapeutic activities;Therapeutic exercise;Stair training   PT Goals (Current goals can be found in the Care Plan section) Acute Rehab PT Goals PT Goal Formulation: With patient Time For Goal Achievement: 09/01/14 Potential to Achieve Goals: Good    Frequency Min 3X/week   Barriers to discharge        Co-evaluation               End of Session   Activity Tolerance: Patient tolerated treatment well Patient left: in chair;with call bell/phone within  reach;with chair alarm set      Functional Assessment Tool Used: clinical judgement Functional Limitation: Mobility: Walking and moving around Mobility: Walking and Moving Around Current Status (K5997): At least 1 percent but less than 20 percent impaired, limited or restricted Mobility: Walking and Moving Around Goal Status [phone]): 0 percent impaired, limited or restricted    Time: 1011-1023 PT Time Calculation (min) (ACUTE ONLY): 12 min   Charges:   PT Evaluation $Initial PT Evaluation Tier I: 1 Procedure     PT G Codes:   PT G-Codes **NOT FOR INPATIENT CLASS** Functional Assessment Tool Used: clinical judgement Functional Limitation: Mobility: Walking and moving around Mobility: Walking and Moving Around Current Status (L9532): At least 1 percent but less than 20 percent impaired, limited or restricted Mobility: Walking and Moving Around Goal Status [phone]): 0 percent impaired, limited or restricted    Ishmeal Rorie,KATHrine E 08/25/2014, 11:14 AM Carmelia Bake, PT, DPT 08/25/2014 Pager: [phone]

## 2014-08-25 NOTE — Progress Notes (Signed)
Spoke with pt concerning HH, she selected [facility].  Referral given to in house rep.

## 2014-08-25 NOTE — Discharge Summary (Signed)
Physician Discharge Summary  Brandi Beck YQM:[id number] DOB: 1928-10-03 DOA: 08/24/2014  PCP: Velna Hatchet, MD  Admit date: 08/24/2014 Discharge date: 08/25/2014  Time spent: 35 minutes  Recommendations for Outpatient Follow-up:  1. Patient admitted for dizziness having a fall that resulted in right-sided rib fracture. Please follow-up on pain management. She was discharged on Norco.    Discharge Diagnoses:  Active Problems:   Near syncope   Essential hypertension   Dyslipidemia   Fracture of rib of right side   Controlled diabetes mellitus type II without complication   Discharge Condition: Stable  Diet recommendation: Heart healthy  Filed Weights   08/24/14 0708 08/24/14 1233 08/25/14 0538  Weight: 82.101 kg (181 lb) 84.5 kg (186 lb 4.6 oz) 83.8 kg (184 lb 11.9 oz)    History of present illness:  Brandi Beck is a 81 y.o. female past medical history of essential hypertension and dyslipidemia currently on Plavix that comes in for dizziness , she relates she was walking to the bathroom around 10 PM which felt dizzy and fell. She says she hit the right side of her chest with the toilet. She was able to get up and walk fine. She felt like she was certainly could've passed out but she did not. She relates she did not loss consciousness, she did not hit her head. She relates no nausea, vomiting, diarrhea, chest pain, shortness of breath, palpitation, weakness, change in vision or new medications. She denies any change is in her medication. No loss of sphincter control, not post ictal. Her husband is at bedside and is helping with history.  Hospital Course:  Patient is a pleasant 81 year old female with a history of hypertension and dyslipidemia admitted to the medicine service on 08/24/2014 after having episode of near-syncope and dizziness at home that resulted in a fall in her bathroom. Imaging studies revealed a right sixth rib fracture. Initial workup otherwise unremarkable having negative  troponin, CT scan of brain without contrast did not reveal acute intracranial abnormalities. Her troponins remained within normal limits 3 sets. She denied having further episodes of dizziness or near syncope. Symptoms may have been related to vasovagal event. Given clinical stability she was discharged on 08/25/2014.   Procedures:  Transthoracic echocardiogram    Discharge Exam: Filed Vitals:   08/25/14 0538  BP: 134/64  Pulse: 75  Temp: 98.1 F (36.7 C)  Resp: 20    General: Patient is in no acute distress, she is awake and alert, reports feeling ready to go home today, skin with time she can go home. Cardiovascular: Regular rate and rhythm normal S1-S2 no murmurs rubs or gallops Respiratory: Normal respiratory effort, lungs overall clear to auscultation bilaterally Abdomen: Soft nontender nondistended Extremities: No edema  Discharge Instructions    Current Discharge Medication List    START taking these medications   Details  oxyCODONE-acetaminophen (PERCOCET/ROXICET) 5-325 MG per tablet Take 1-2 tablets by mouth every 4 (four) hours as needed for severe pain. Qty: 15 tablet, Refills: 0      CONTINUE these medications which have NOT CHANGED   Details  acetaminophen (TYLENOL) 500 MG tablet Take 1,000 mg by mouth every 8 (eight) hours as needed for mild pain or moderate pain.    allopurinol (ZYLOPRIM) 100 MG tablet Take 200 mg by mouth daily.    atorvastatin (LIPITOR) 40 MG tablet Take 40 mg by mouth daily.    carvedilol (COREG) 12.5 MG tablet Take 12.5 mg by mouth daily.    clobetasol ointment (TEMOVATE) 0.05 %  Apply 1 application topically daily as needed (sclerosis).    clopidogrel (PLAVIX) 75 MG tablet Take 75 mg by mouth daily.    fluocinonide cream (LIDEX) 1.74 % Apply 1 application topically daily as needed (rash).    fluticasone (FLONASE) 50 MCG/ACT nasal spray Place 1 spray into both nostrils daily as needed for allergies or rhinitis.    losartan  (COZAAR) 25 MG tablet Take 25 mg by mouth daily.    metFORMIN (GLUCOPHAGE) 500 MG tablet Take 500 mg by mouth 2 (two) times daily with a meal.    NIFEdipine (PROCARDIA-XL/ADALAT-CC/NIFEDICAL-XL) 30 MG 24 hr tablet Take 30 mg by mouth daily.    Omega-3 Fatty Acids (FISH OIL PO) Take 1 capsule by mouth daily.    PRESCRIPTION MEDICATION Place 1 application vaginally daily. Testosterone 2% in VCB Compound. Use vaginally daily.    spironolactone (ALDACTONE) 25 MG tablet Take 25 mg by mouth daily.       Allergies  Allergen Reactions  . Citrus Swelling and Cough    Swollen lips  . Peanuts [Peanut Oil] Swelling and Cough    Swollen lips  . Aspirin Rash  . Niacin And Related Rash   Follow-up Information    Follow up with Pcp Not In System. Schedule an appointment as soon as possible for a visit in 3 days.       The results of significant diagnostics from this hospitalization (including imaging, microbiology, ancillary and laboratory) are listed below for reference.    Significant Diagnostic Studies: Dg Chest 2 View  08/24/2014   CLINICAL DATA:  Fall last night  EXAM: CHEST  2 VIEW  COMPARISON:  None.  FINDINGS: The heart is mildly enlarged. Vascular congestion without interstitial edema is present. There is a large hiatal hernia with an air-fluid level. There is associated atelectasis versus consolidation at the left base. Minimal hypoaeration change at the right base. Degenerative changes in the thoracic spine without obvious compression deformity. Osteopenia. No pleural effusion. No obvious acute bony injury. No pneumothorax.  IMPRESSION: Cardiomegaly and vascular congestion without edema.  Left basilar atelectasis versus consolidation associated with a large hiatal hernia.   Electronically Signed   By: Marybelle Killings M.D.   On: 08/24/2014 08:43   Dg Ribs Unilateral Right  08/24/2014   CLINICAL DATA:  Fall.  Pain  EXAM: RIGHT RIBS - 2 VIEW  COMPARISON:  08/24/2014  FINDINGS: Possible  nondisplaced fracture right anterior sixth rib.  Right lower lobe atelectasis. No effusion or pneumothorax on the right.  IMPRESSION: Possible nondisplaced fracture right anterior sixth rib  Right lower lobe atelectasis.   Electronically Signed   By: Franchot Gallo M.D.   On: 08/24/2014 08:35   Ct Head Wo Contrast  08/24/2014   CLINICAL DATA:  81 year old diabetic hypertensive female with hyperlipidemia felt dizzy last evening and fell. Does not remember if struck head. Prior cervical fusion 2001. Initial encounter.  EXAM: CT HEAD WITHOUT CONTRAST  CT CERVICAL SPINE WITHOUT CONTRAST  TECHNIQUE: Multidetector CT imaging of the head and cervical spine was performed following the standard protocol without intravenous contrast. Multiplanar CT image reconstructions of the cervical spine were also generated.  COMPARISON:  None.  FINDINGS: CT HEAD FINDINGS  No skull fracture or intracranial hemorrhage.  Small vessel disease type changes most notable right parietal lobe. Remote infarct right basal ganglia. No CT evidence of large acute infarct.  No intracranial mass lesion noted on this unenhanced exam.  No hydrocephalus.  Vascular calcifications.  Mastoid air cells,  middle ear cavities and visualized paranasal sinuses are clear  CT CERVICAL SPINE FINDINGS  No cervical spine fracture or malalignment.  Prior fusion C5-C7.  Prominent transverse ligament hypertrophy.  Cervical spondylotic changes most prominent C3-4 and C4-5 with various degrees of spinal stenosis and foraminal narrowing. If ligamentous injury or cord injury were of high clinical concern, MR imaging could be obtained for further delineation.  Heterogeneous thyroid gland with calcifications incompletely assessed on present exam. Thyroid ultrasound can be obtained for further delineation.  Vascular calcifications.  IMPRESSION: Head CT:  No skull fracture or intracranial hemorrhage.  Small vessel disease type changes most prominent right parietal lobe with  remote right basal ganglia infarct. No CT evidence of large acute infarct.  Cervical spine CT:  No cervical spine fracture or malalignment.  Prior fusion C5-C7.  Prominent transverse ligament hypertrophy.  Cervical spondylotic changes most prominent C3-4 and C4-5 with various degrees of spinal stenosis and foraminal narrowing.   Electronically Signed   By: Genia Del M.D.   On: 08/24/2014 08:36   Ct Cervical Spine Wo Contrast  08/24/2014   CLINICAL DATA:  81 year old diabetic hypertensive female with hyperlipidemia felt dizzy last evening and fell. Does not remember if struck head. Prior cervical fusion 2001. Initial encounter.  EXAM: CT HEAD WITHOUT CONTRAST  CT CERVICAL SPINE WITHOUT CONTRAST  TECHNIQUE: Multidetector CT imaging of the head and cervical spine was performed following the standard protocol without intravenous contrast. Multiplanar CT image reconstructions of the cervical spine were also generated.  COMPARISON:  None.  FINDINGS: CT HEAD FINDINGS  No skull fracture or intracranial hemorrhage.  Small vessel disease type changes most notable right parietal lobe. Remote infarct right basal ganglia. No CT evidence of large acute infarct.  No intracranial mass lesion noted on this unenhanced exam.  No hydrocephalus.  Vascular calcifications.  Mastoid air cells, middle ear cavities and visualized paranasal sinuses are clear  CT CERVICAL SPINE FINDINGS  No cervical spine fracture or malalignment.  Prior fusion C5-C7.  Prominent transverse ligament hypertrophy.  Cervical spondylotic changes most prominent C3-4 and C4-5 with various degrees of spinal stenosis and foraminal narrowing. If ligamentous injury or cord injury were of high clinical concern, MR imaging could be obtained for further delineation.  Heterogeneous thyroid gland with calcifications incompletely assessed on present exam. Thyroid ultrasound can be obtained for further delineation.  Vascular calcifications.  IMPRESSION: Head CT:  No skull  fracture or intracranial hemorrhage.  Small vessel disease type changes most prominent right parietal lobe with remote right basal ganglia infarct. No CT evidence of large acute infarct.  Cervical spine CT:  No cervical spine fracture or malalignment.  Prior fusion C5-C7.  Prominent transverse ligament hypertrophy.  Cervical spondylotic changes most prominent C3-4 and C4-5 with various degrees of spinal stenosis and foraminal narrowing.   Electronically Signed   By: Genia Del M.D.   On: 08/24/2014 08:36    Microbiology: No results found for this or any previous visit (from the past 240 hour(s)).   Labs: Basic Metabolic Panel:  Recent Labs Lab 08/24/14 0746 08/25/14 0118  NA 137 136  K 3.9 4.4  CL 107 108  CO2 19* 23  GLUCOSE 251* 141*  BUN 20 16  CREATININE 0.86 0.70  CALCIUM 9.3 9.2   Liver Function Tests:  Recent Labs Lab 08/25/14 0118  AST 15  ALT 16  ALKPHOS 80  BILITOT 0.8  PROT 6.2*  ALBUMIN 3.9   No results for input(s): LIPASE, AMYLASE in  the last 168 hours. No results for input(s): AMMONIA in the last 168 hours. CBC:  Recent Labs Lab 08/24/14 0746 08/25/14 0118  WBC 8.5 8.2  NEUTROABS 7.3  --   HGB 14.3 13.6  HCT 43.6 41.5  MCV 93.2 94.5  PLT 157 167   Cardiac Enzymes:  Recent Labs Lab 08/24/14 0746 08/24/14 1356 08/24/14 1915 08/25/14 0118  TROPONINI <0.03 <0.03 <0.03 <0.03   BNP: BNP (last 3 results) No results for input(s): BNP in the last 8760 hours.  ProBNP (last 3 results) No results for input(s): PROBNP in the last 8760 hours.  CBG:  Recent Labs Lab 08/25/14 0537  GLUCAP 135*       Signed:  Kelvin Cellar  [facility] 08/25/2014, 9:40 AM

## 2014-08-27 DIAGNOSIS — S2231XA Fracture of one rib, right side, initial encounter for closed fracture: Secondary | ICD-10-CM | POA: Diagnosis not present

## 2014-08-27 DIAGNOSIS — Z6833 Body mass index (BMI) 33.0-33.9, adult: Secondary | ICD-10-CM | POA: Diagnosis not present

## 2014-08-27 DIAGNOSIS — I10 Essential (primary) hypertension: Secondary | ICD-10-CM | POA: Diagnosis not present

## 2014-08-27 DIAGNOSIS — K5909 Other constipation: Secondary | ICD-10-CM | POA: Diagnosis not present

## 2014-08-27 DIAGNOSIS — E1151 Type 2 diabetes mellitus with diabetic peripheral angiopathy without gangrene: Secondary | ICD-10-CM | POA: Diagnosis not present

## 2014-09-22 DIAGNOSIS — L84 Corns and callosities: Secondary | ICD-10-CM | POA: Diagnosis not present

## 2014-09-22 DIAGNOSIS — E1151 Type 2 diabetes mellitus with diabetic peripheral angiopathy without gangrene: Secondary | ICD-10-CM | POA: Diagnosis not present

## 2014-09-22 DIAGNOSIS — L602 Onychogryphosis: Secondary | ICD-10-CM | POA: Diagnosis not present

## 2014-10-20 ENCOUNTER — Other Ambulatory Visit: Payer: Self-pay

## 2014-10-20 DIAGNOSIS — Z1231 Encounter for screening mammogram for malignant neoplasm of breast: Secondary | ICD-10-CM

## 2014-10-28 DIAGNOSIS — Z23 Encounter for immunization: Secondary | ICD-10-CM | POA: Diagnosis not present

## 2014-11-22 ENCOUNTER — Ambulatory Visit
Admission: RE | Admit: 2014-11-22 | Discharge: 2014-11-22 | Disposition: A | Discharge status: Home / Self Care | Payer: Medicare Other | Source: Ambulatory Visit

## 2014-11-22 DIAGNOSIS — Z1231 Encounter for screening mammogram for malignant neoplasm of breast: Secondary | ICD-10-CM | POA: Diagnosis not present

## 2014-11-24 DIAGNOSIS — E1351 Other specified diabetes mellitus with diabetic peripheral angiopathy without gangrene: Secondary | ICD-10-CM | POA: Diagnosis not present

## 2014-11-24 DIAGNOSIS — L602 Onychogryphosis: Secondary | ICD-10-CM | POA: Diagnosis not present

## 2014-11-24 DIAGNOSIS — I70293 Other atherosclerosis of native arteries of extremities, bilateral legs: Secondary | ICD-10-CM | POA: Diagnosis not present

## 2014-11-24 DIAGNOSIS — L84 Corns and callosities: Secondary | ICD-10-CM | POA: Diagnosis not present

## 2014-12-09 DIAGNOSIS — M109 Gout, unspecified: Secondary | ICD-10-CM | POA: Diagnosis not present

## 2014-12-09 DIAGNOSIS — E119 Type 2 diabetes mellitus without complications: Secondary | ICD-10-CM | POA: Diagnosis not present

## 2014-12-09 DIAGNOSIS — I10 Essential (primary) hypertension: Secondary | ICD-10-CM | POA: Diagnosis not present

## 2014-12-15 DIAGNOSIS — K5909 Other constipation: Secondary | ICD-10-CM | POA: Diagnosis not present

## 2014-12-15 DIAGNOSIS — G459 Transient cerebral ischemic attack, unspecified: Secondary | ICD-10-CM | POA: Diagnosis not present

## 2014-12-15 DIAGNOSIS — M109 Gout, unspecified: Secondary | ICD-10-CM | POA: Diagnosis not present

## 2014-12-15 DIAGNOSIS — Z6834 Body mass index (BMI) 34.0-34.9, adult: Secondary | ICD-10-CM | POA: Diagnosis not present

## 2014-12-15 DIAGNOSIS — Z1389 Encounter for screening for other disorder: Secondary | ICD-10-CM | POA: Diagnosis not present

## 2014-12-15 DIAGNOSIS — Z0000 Encounter for general adult medical examination without abnormal findings: Secondary | ICD-10-CM | POA: Diagnosis not present

## 2014-12-15 DIAGNOSIS — E784 Other hyperlipidemia: Secondary | ICD-10-CM | POA: Diagnosis not present

## 2014-12-15 DIAGNOSIS — E119 Type 2 diabetes mellitus without complications: Secondary | ICD-10-CM | POA: Diagnosis not present

## 2014-12-15 DIAGNOSIS — I10 Essential (primary) hypertension: Secondary | ICD-10-CM | POA: Diagnosis not present

## 2015-02-02 DIAGNOSIS — I70293 Other atherosclerosis of native arteries of extremities, bilateral legs: Secondary | ICD-10-CM | POA: Diagnosis not present

## 2015-02-02 DIAGNOSIS — L84 Corns and callosities: Secondary | ICD-10-CM | POA: Diagnosis not present

## 2015-02-02 DIAGNOSIS — L602 Onychogryphosis: Secondary | ICD-10-CM | POA: Diagnosis not present

## 2015-02-02 DIAGNOSIS — E1351 Other specified diabetes mellitus with diabetic peripheral angiopathy without gangrene: Secondary | ICD-10-CM | POA: Diagnosis not present

## 2015-03-23 DIAGNOSIS — Z6833 Body mass index (BMI) 33.0-33.9, adult: Secondary | ICD-10-CM | POA: Diagnosis not present

## 2015-03-23 DIAGNOSIS — M109 Gout, unspecified: Secondary | ICD-10-CM | POA: Diagnosis not present

## 2015-03-23 DIAGNOSIS — D692 Other nonthrombocytopenic purpura: Secondary | ICD-10-CM | POA: Diagnosis not present

## 2015-03-23 DIAGNOSIS — I10 Essential (primary) hypertension: Secondary | ICD-10-CM | POA: Diagnosis not present

## 2015-03-23 DIAGNOSIS — E1165 Type 2 diabetes mellitus with hyperglycemia: Secondary | ICD-10-CM | POA: Diagnosis not present

## 2015-03-23 DIAGNOSIS — E784 Other hyperlipidemia: Secondary | ICD-10-CM | POA: Diagnosis not present

## 2015-03-23 DIAGNOSIS — E119 Type 2 diabetes mellitus without complications: Secondary | ICD-10-CM | POA: Diagnosis not present

## 2015-03-23 DIAGNOSIS — M25539 Pain in unspecified wrist: Secondary | ICD-10-CM | POA: Diagnosis not present

## 2015-04-07 DIAGNOSIS — E1351 Other specified diabetes mellitus with diabetic peripheral angiopathy without gangrene: Secondary | ICD-10-CM | POA: Diagnosis not present

## 2015-04-07 DIAGNOSIS — I70293 Other atherosclerosis of native arteries of extremities, bilateral legs: Secondary | ICD-10-CM | POA: Diagnosis not present

## 2015-04-07 DIAGNOSIS — L602 Onychogryphosis: Secondary | ICD-10-CM | POA: Diagnosis not present

## 2015-04-07 DIAGNOSIS — L84 Corns and callosities: Secondary | ICD-10-CM | POA: Diagnosis not present

## 2015-06-09 DIAGNOSIS — L602 Onychogryphosis: Secondary | ICD-10-CM | POA: Diagnosis not present

## 2015-06-09 DIAGNOSIS — E1351 Other specified diabetes mellitus with diabetic peripheral angiopathy without gangrene: Secondary | ICD-10-CM | POA: Diagnosis not present

## 2015-06-09 DIAGNOSIS — L84 Corns and callosities: Secondary | ICD-10-CM | POA: Diagnosis not present

## 2015-06-21 DIAGNOSIS — E1165 Type 2 diabetes mellitus with hyperglycemia: Secondary | ICD-10-CM | POA: Diagnosis not present

## 2015-06-21 DIAGNOSIS — I10 Essential (primary) hypertension: Secondary | ICD-10-CM | POA: Diagnosis not present

## 2015-06-21 DIAGNOSIS — G459 Transient cerebral ischemic attack, unspecified: Secondary | ICD-10-CM | POA: Diagnosis not present

## 2015-06-21 DIAGNOSIS — E784 Other hyperlipidemia: Secondary | ICD-10-CM | POA: Diagnosis not present

## 2015-06-21 DIAGNOSIS — Z6833 Body mass index (BMI) 33.0-33.9, adult: Secondary | ICD-10-CM | POA: Diagnosis not present

## 2015-06-21 DIAGNOSIS — R197 Diarrhea, unspecified: Secondary | ICD-10-CM | POA: Diagnosis not present

## 2015-07-28 DIAGNOSIS — Z961 Presence of intraocular lens: Secondary | ICD-10-CM | POA: Diagnosis not present

## 2015-07-28 DIAGNOSIS — Z0100 Encounter for examination of eyes and vision without abnormal findings: Secondary | ICD-10-CM | POA: Diagnosis not present

## 2015-07-28 DIAGNOSIS — E119 Type 2 diabetes mellitus without complications: Secondary | ICD-10-CM | POA: Diagnosis not present

## 2015-08-11 DIAGNOSIS — L84 Corns and callosities: Secondary | ICD-10-CM | POA: Diagnosis not present

## 2015-08-11 DIAGNOSIS — E1351 Other specified diabetes mellitus with diabetic peripheral angiopathy without gangrene: Secondary | ICD-10-CM | POA: Diagnosis not present

## 2015-08-11 DIAGNOSIS — I70293 Other atherosclerosis of native arteries of extremities, bilateral legs: Secondary | ICD-10-CM | POA: Diagnosis not present

## 2015-08-11 DIAGNOSIS — L602 Onychogryphosis: Secondary | ICD-10-CM | POA: Diagnosis not present

## 2015-09-27 DIAGNOSIS — Z23 Encounter for immunization: Secondary | ICD-10-CM | POA: Diagnosis not present

## 2015-09-27 DIAGNOSIS — M25569 Pain in unspecified knee: Secondary | ICD-10-CM | POA: Diagnosis not present

## 2015-09-27 DIAGNOSIS — I10 Essential (primary) hypertension: Secondary | ICD-10-CM | POA: Diagnosis not present

## 2015-09-27 DIAGNOSIS — Z6833 Body mass index (BMI) 33.0-33.9, adult: Secondary | ICD-10-CM | POA: Diagnosis not present

## 2015-09-27 DIAGNOSIS — E1165 Type 2 diabetes mellitus with hyperglycemia: Secondary | ICD-10-CM | POA: Diagnosis not present

## 2015-09-27 DIAGNOSIS — E784 Other hyperlipidemia: Secondary | ICD-10-CM | POA: Diagnosis not present

## 2015-10-04 DIAGNOSIS — H903 Sensorineural hearing loss, bilateral: Secondary | ICD-10-CM | POA: Diagnosis not present

## 2015-10-18 DIAGNOSIS — L602 Onychogryphosis: Secondary | ICD-10-CM | POA: Diagnosis not present

## 2015-10-18 DIAGNOSIS — I70293 Other atherosclerosis of native arteries of extremities, bilateral legs: Secondary | ICD-10-CM | POA: Diagnosis not present

## 2015-10-18 DIAGNOSIS — E1351 Other specified diabetes mellitus with diabetic peripheral angiopathy without gangrene: Secondary | ICD-10-CM | POA: Diagnosis not present

## 2015-10-18 DIAGNOSIS — L84 Corns and callosities: Secondary | ICD-10-CM | POA: Diagnosis not present

## 2015-10-19 DIAGNOSIS — L821 Other seborrheic keratosis: Secondary | ICD-10-CM | POA: Diagnosis not present

## 2015-10-19 DIAGNOSIS — L814 Other melanin hyperpigmentation: Secondary | ICD-10-CM | POA: Diagnosis not present

## 2015-10-19 DIAGNOSIS — D1801 Hemangioma of skin and subcutaneous tissue: Secondary | ICD-10-CM | POA: Diagnosis not present

## 2015-10-31 ENCOUNTER — Other Ambulatory Visit: Payer: Self-pay | Admitting: Internal Medicine

## 2015-10-31 DIAGNOSIS — Z1231 Encounter for screening mammogram for malignant neoplasm of breast: Secondary | ICD-10-CM

## 2015-11-25 ENCOUNTER — Ambulatory Visit
Admission: RE | Admit: 2015-11-25 | Discharge: 2015-11-25 | Disposition: A | Discharge status: Home / Self Care | Payer: Medicare Other | Source: Ambulatory Visit | Attending: Internal Medicine | Admitting: Internal Medicine

## 2015-11-25 DIAGNOSIS — Z1231 Encounter for screening mammogram for malignant neoplasm of breast: Secondary | ICD-10-CM | POA: Diagnosis not present

## 2015-12-14 DIAGNOSIS — E1165 Type 2 diabetes mellitus with hyperglycemia: Secondary | ICD-10-CM | POA: Diagnosis not present

## 2015-12-14 DIAGNOSIS — E784 Other hyperlipidemia: Secondary | ICD-10-CM | POA: Diagnosis not present

## 2015-12-14 DIAGNOSIS — R8299 Other abnormal findings in urine: Secondary | ICD-10-CM | POA: Diagnosis not present

## 2015-12-14 DIAGNOSIS — M109 Gout, unspecified: Secondary | ICD-10-CM | POA: Diagnosis not present

## 2015-12-14 DIAGNOSIS — N390 Urinary tract infection, site not specified: Secondary | ICD-10-CM | POA: Diagnosis not present

## 2015-12-21 DIAGNOSIS — E784 Other hyperlipidemia: Secondary | ICD-10-CM | POA: Diagnosis not present

## 2015-12-21 DIAGNOSIS — M109 Gout, unspecified: Secondary | ICD-10-CM | POA: Diagnosis not present

## 2015-12-21 DIAGNOSIS — Z23 Encounter for immunization: Secondary | ICD-10-CM | POA: Diagnosis not present

## 2015-12-21 DIAGNOSIS — G459 Transient cerebral ischemic attack, unspecified: Secondary | ICD-10-CM | POA: Diagnosis not present

## 2015-12-21 DIAGNOSIS — Z0000 Encounter for general adult medical examination without abnormal findings: Secondary | ICD-10-CM | POA: Diagnosis not present

## 2015-12-21 DIAGNOSIS — Z6834 Body mass index (BMI) 34.0-34.9, adult: Secondary | ICD-10-CM | POA: Diagnosis not present

## 2015-12-21 DIAGNOSIS — I10 Essential (primary) hypertension: Secondary | ICD-10-CM | POA: Diagnosis not present

## 2015-12-21 DIAGNOSIS — E1165 Type 2 diabetes mellitus with hyperglycemia: Secondary | ICD-10-CM | POA: Diagnosis not present

## 2015-12-21 DIAGNOSIS — Z1389 Encounter for screening for other disorder: Secondary | ICD-10-CM | POA: Diagnosis not present

## 2015-12-21 DIAGNOSIS — D692 Other nonthrombocytopenic purpura: Secondary | ICD-10-CM | POA: Diagnosis not present

## 2015-12-28 DIAGNOSIS — L602 Onychogryphosis: Secondary | ICD-10-CM | POA: Diagnosis not present

## 2015-12-28 DIAGNOSIS — L84 Corns and callosities: Secondary | ICD-10-CM | POA: Diagnosis not present

## 2015-12-28 DIAGNOSIS — E1151 Type 2 diabetes mellitus with diabetic peripheral angiopathy without gangrene: Secondary | ICD-10-CM | POA: Diagnosis not present

## 2016-03-20 DIAGNOSIS — B353 Tinea pedis: Secondary | ICD-10-CM | POA: Diagnosis not present

## 2016-03-20 DIAGNOSIS — I10 Essential (primary) hypertension: Secondary | ICD-10-CM | POA: Diagnosis not present

## 2016-03-20 DIAGNOSIS — Z6834 Body mass index (BMI) 34.0-34.9, adult: Secondary | ICD-10-CM | POA: Diagnosis not present

## 2016-03-20 DIAGNOSIS — E1165 Type 2 diabetes mellitus with hyperglycemia: Secondary | ICD-10-CM | POA: Diagnosis not present

## 2016-03-20 DIAGNOSIS — E784 Other hyperlipidemia: Secondary | ICD-10-CM | POA: Diagnosis not present

## 2016-03-20 DIAGNOSIS — D692 Other nonthrombocytopenic purpura: Secondary | ICD-10-CM | POA: Diagnosis not present

## 2016-03-20 DIAGNOSIS — E1151 Type 2 diabetes mellitus with diabetic peripheral angiopathy without gangrene: Secondary | ICD-10-CM | POA: Diagnosis not present

## 2016-03-20 DIAGNOSIS — L602 Onychogryphosis: Secondary | ICD-10-CM | POA: Diagnosis not present

## 2016-05-31 DIAGNOSIS — B351 Tinea unguium: Secondary | ICD-10-CM | POA: Diagnosis not present

## 2016-05-31 DIAGNOSIS — M79671 Pain in right foot: Secondary | ICD-10-CM | POA: Diagnosis not present

## 2016-05-31 DIAGNOSIS — L84 Corns and callosities: Secondary | ICD-10-CM | POA: Diagnosis not present

## 2016-05-31 DIAGNOSIS — M79672 Pain in left foot: Secondary | ICD-10-CM | POA: Diagnosis not present

## 2016-05-31 DIAGNOSIS — E1151 Type 2 diabetes mellitus with diabetic peripheral angiopathy without gangrene: Secondary | ICD-10-CM | POA: Diagnosis not present

## 2016-06-21 DIAGNOSIS — E784 Other hyperlipidemia: Secondary | ICD-10-CM | POA: Diagnosis not present

## 2016-06-21 DIAGNOSIS — Z6834 Body mass index (BMI) 34.0-34.9, adult: Secondary | ICD-10-CM | POA: Diagnosis not present

## 2016-06-21 DIAGNOSIS — E1165 Type 2 diabetes mellitus with hyperglycemia: Secondary | ICD-10-CM | POA: Diagnosis not present

## 2016-06-21 DIAGNOSIS — I10 Essential (primary) hypertension: Secondary | ICD-10-CM | POA: Diagnosis not present

## 2016-07-30 DIAGNOSIS — Z961 Presence of intraocular lens: Secondary | ICD-10-CM | POA: Diagnosis not present

## 2016-07-30 DIAGNOSIS — E119 Type 2 diabetes mellitus without complications: Secondary | ICD-10-CM | POA: Diagnosis not present

## 2016-07-30 DIAGNOSIS — H5202 Hypermetropia, left eye: Secondary | ICD-10-CM | POA: Diagnosis not present

## 2016-08-07 DIAGNOSIS — B351 Tinea unguium: Secondary | ICD-10-CM | POA: Diagnosis not present

## 2016-08-07 DIAGNOSIS — E1151 Type 2 diabetes mellitus with diabetic peripheral angiopathy without gangrene: Secondary | ICD-10-CM | POA: Diagnosis not present

## 2016-08-07 DIAGNOSIS — M79672 Pain in left foot: Secondary | ICD-10-CM | POA: Diagnosis not present

## 2016-08-07 DIAGNOSIS — B353 Tinea pedis: Secondary | ICD-10-CM | POA: Diagnosis not present

## 2016-08-07 DIAGNOSIS — M79671 Pain in right foot: Secondary | ICD-10-CM | POA: Diagnosis not present

## 2016-09-20 DIAGNOSIS — I10 Essential (primary) hypertension: Secondary | ICD-10-CM | POA: Diagnosis not present

## 2016-09-20 DIAGNOSIS — H9193 Unspecified hearing loss, bilateral: Secondary | ICD-10-CM | POA: Diagnosis not present

## 2016-09-20 DIAGNOSIS — G5621 Lesion of ulnar nerve, right upper limb: Secondary | ICD-10-CM | POA: Diagnosis not present

## 2016-09-20 DIAGNOSIS — Z23 Encounter for immunization: Secondary | ICD-10-CM | POA: Diagnosis not present

## 2016-09-20 DIAGNOSIS — Z6833 Body mass index (BMI) 33.0-33.9, adult: Secondary | ICD-10-CM | POA: Diagnosis not present

## 2016-09-20 DIAGNOSIS — E1165 Type 2 diabetes mellitus with hyperglycemia: Secondary | ICD-10-CM | POA: Diagnosis not present

## 2016-09-20 DIAGNOSIS — E784 Other hyperlipidemia: Secondary | ICD-10-CM | POA: Diagnosis not present

## 2016-09-20 DIAGNOSIS — R197 Diarrhea, unspecified: Secondary | ICD-10-CM | POA: Diagnosis not present

## 2016-09-20 DIAGNOSIS — M779 Enthesopathy, unspecified: Secondary | ICD-10-CM | POA: Diagnosis not present

## 2016-10-02 DIAGNOSIS — H903 Sensorineural hearing loss, bilateral: Secondary | ICD-10-CM | POA: Diagnosis not present

## 2016-10-16 DIAGNOSIS — E1151 Type 2 diabetes mellitus with diabetic peripheral angiopathy without gangrene: Secondary | ICD-10-CM | POA: Diagnosis not present

## 2016-10-16 DIAGNOSIS — B351 Tinea unguium: Secondary | ICD-10-CM | POA: Diagnosis not present

## 2016-10-29 ENCOUNTER — Other Ambulatory Visit: Payer: Self-pay | Admitting: Internal Medicine

## 2016-10-29 DIAGNOSIS — Z1231 Encounter for screening mammogram for malignant neoplasm of breast: Secondary | ICD-10-CM

## 2016-11-26 ENCOUNTER — Ambulatory Visit
Admission: RE | Admit: 2016-11-26 | Discharge: 2016-11-26 | Disposition: A | Discharge status: Home / Self Care | Payer: Medicare Other | Source: Ambulatory Visit | Attending: Internal Medicine | Admitting: Internal Medicine

## 2016-11-26 DIAGNOSIS — Z1231 Encounter for screening mammogram for malignant neoplasm of breast: Secondary | ICD-10-CM

## 2016-12-17 DIAGNOSIS — E1165 Type 2 diabetes mellitus with hyperglycemia: Secondary | ICD-10-CM | POA: Diagnosis not present

## 2016-12-17 DIAGNOSIS — E7849 Other hyperlipidemia: Secondary | ICD-10-CM | POA: Diagnosis not present

## 2016-12-17 DIAGNOSIS — M109 Gout, unspecified: Secondary | ICD-10-CM | POA: Diagnosis not present

## 2016-12-17 DIAGNOSIS — R82998 Other abnormal findings in urine: Secondary | ICD-10-CM | POA: Diagnosis not present

## 2016-12-17 DIAGNOSIS — I10 Essential (primary) hypertension: Secondary | ICD-10-CM | POA: Diagnosis not present

## 2016-12-25 DIAGNOSIS — B353 Tinea pedis: Secondary | ICD-10-CM | POA: Diagnosis not present

## 2016-12-25 DIAGNOSIS — L602 Onychogryphosis: Secondary | ICD-10-CM | POA: Diagnosis not present

## 2016-12-25 DIAGNOSIS — E1151 Type 2 diabetes mellitus with diabetic peripheral angiopathy without gangrene: Secondary | ICD-10-CM | POA: Diagnosis not present

## 2016-12-25 DIAGNOSIS — M6281 Muscle weakness (generalized): Secondary | ICD-10-CM | POA: Diagnosis not present

## 2016-12-25 DIAGNOSIS — R2689 Other abnormalities of gait and mobility: Secondary | ICD-10-CM | POA: Diagnosis not present

## 2016-12-27 DIAGNOSIS — E7849 Other hyperlipidemia: Secondary | ICD-10-CM | POA: Diagnosis not present

## 2016-12-27 DIAGNOSIS — Z0000 Encounter for general adult medical examination without abnormal findings: Secondary | ICD-10-CM | POA: Diagnosis not present

## 2016-12-27 DIAGNOSIS — Z6833 Body mass index (BMI) 33.0-33.9, adult: Secondary | ICD-10-CM | POA: Diagnosis not present

## 2016-12-27 DIAGNOSIS — D692 Other nonthrombocytopenic purpura: Secondary | ICD-10-CM | POA: Diagnosis not present

## 2016-12-27 DIAGNOSIS — E1165 Type 2 diabetes mellitus with hyperglycemia: Secondary | ICD-10-CM | POA: Diagnosis not present

## 2016-12-27 DIAGNOSIS — I10 Essential (primary) hypertension: Secondary | ICD-10-CM | POA: Diagnosis not present

## 2016-12-27 DIAGNOSIS — R197 Diarrhea, unspecified: Secondary | ICD-10-CM | POA: Diagnosis not present

## 2016-12-27 DIAGNOSIS — M109 Gout, unspecified: Secondary | ICD-10-CM | POA: Diagnosis not present

## 2016-12-27 DIAGNOSIS — Z1389 Encounter for screening for other disorder: Secondary | ICD-10-CM | POA: Diagnosis not present

## 2017-01-02 DIAGNOSIS — Z1212 Encounter for screening for malignant neoplasm of rectum: Secondary | ICD-10-CM | POA: Diagnosis not present

## 2017-03-05 DIAGNOSIS — E1151 Type 2 diabetes mellitus with diabetic peripheral angiopathy without gangrene: Secondary | ICD-10-CM | POA: Diagnosis not present

## 2017-03-05 DIAGNOSIS — L602 Onychogryphosis: Secondary | ICD-10-CM | POA: Diagnosis not present

## 2017-03-19 DIAGNOSIS — I10 Essential (primary) hypertension: Secondary | ICD-10-CM | POA: Diagnosis not present

## 2017-03-19 DIAGNOSIS — B372 Candidiasis of skin and nail: Secondary | ICD-10-CM | POA: Diagnosis not present

## 2017-03-26 DIAGNOSIS — L308 Other specified dermatitis: Secondary | ICD-10-CM | POA: Diagnosis not present

## 2017-03-26 DIAGNOSIS — E1165 Type 2 diabetes mellitus with hyperglycemia: Secondary | ICD-10-CM | POA: Diagnosis not present

## 2017-03-26 DIAGNOSIS — L900 Lichen sclerosus et atrophicus: Secondary | ICD-10-CM | POA: Diagnosis not present

## 2017-03-26 DIAGNOSIS — Z6833 Body mass index (BMI) 33.0-33.9, adult: Secondary | ICD-10-CM | POA: Diagnosis not present

## 2017-04-04 DIAGNOSIS — M545 Low back pain: Secondary | ICD-10-CM | POA: Diagnosis not present

## 2017-04-04 DIAGNOSIS — M25551 Pain in right hip: Secondary | ICD-10-CM | POA: Diagnosis not present

## 2017-04-04 DIAGNOSIS — R229 Localized swelling, mass and lump, unspecified: Secondary | ICD-10-CM | POA: Diagnosis not present

## 2017-04-04 DIAGNOSIS — L538 Other specified erythematous conditions: Secondary | ICD-10-CM | POA: Diagnosis not present

## 2017-04-04 DIAGNOSIS — Z6832 Body mass index (BMI) 32.0-32.9, adult: Secondary | ICD-10-CM | POA: Diagnosis not present

## 2017-04-11 DIAGNOSIS — M545 Low back pain: Secondary | ICD-10-CM | POA: Diagnosis not present

## 2017-04-23 DIAGNOSIS — M545 Low back pain: Secondary | ICD-10-CM | POA: Diagnosis not present

## 2017-04-23 DIAGNOSIS — M5416 Radiculopathy, lumbar region: Secondary | ICD-10-CM | POA: Diagnosis not present

## 2017-04-23 DIAGNOSIS — M48061 Spinal stenosis, lumbar region without neurogenic claudication: Secondary | ICD-10-CM | POA: Diagnosis not present

## 2017-05-07 DIAGNOSIS — M546 Pain in thoracic spine: Secondary | ICD-10-CM | POA: Diagnosis not present

## 2017-05-14 DIAGNOSIS — L602 Onychogryphosis: Secondary | ICD-10-CM | POA: Diagnosis not present

## 2017-05-14 DIAGNOSIS — M546 Pain in thoracic spine: Secondary | ICD-10-CM | POA: Diagnosis not present

## 2017-05-14 DIAGNOSIS — E1151 Type 2 diabetes mellitus with diabetic peripheral angiopathy without gangrene: Secondary | ICD-10-CM | POA: Diagnosis not present

## 2017-05-22 DIAGNOSIS — M5416 Radiculopathy, lumbar region: Secondary | ICD-10-CM | POA: Diagnosis not present

## 2017-05-22 DIAGNOSIS — M48061 Spinal stenosis, lumbar region without neurogenic claudication: Secondary | ICD-10-CM | POA: Diagnosis not present

## 2017-05-22 DIAGNOSIS — M545 Low back pain: Secondary | ICD-10-CM | POA: Diagnosis not present

## 2017-06-11 DIAGNOSIS — M48061 Spinal stenosis, lumbar region without neurogenic claudication: Secondary | ICD-10-CM | POA: Diagnosis not present

## 2017-06-11 DIAGNOSIS — M545 Low back pain: Secondary | ICD-10-CM | POA: Diagnosis not present

## 2017-06-11 DIAGNOSIS — M5416 Radiculopathy, lumbar region: Secondary | ICD-10-CM | POA: Diagnosis not present

## 2017-07-30 DIAGNOSIS — L602 Onychogryphosis: Secondary | ICD-10-CM | POA: Diagnosis not present

## 2017-07-30 DIAGNOSIS — E1151 Type 2 diabetes mellitus with diabetic peripheral angiopathy without gangrene: Secondary | ICD-10-CM | POA: Diagnosis not present

## 2017-08-01 DIAGNOSIS — Z961 Presence of intraocular lens: Secondary | ICD-10-CM | POA: Diagnosis not present

## 2017-08-01 DIAGNOSIS — H5203 Hypermetropia, bilateral: Secondary | ICD-10-CM | POA: Diagnosis not present

## 2017-08-01 DIAGNOSIS — H52203 Unspecified astigmatism, bilateral: Secondary | ICD-10-CM | POA: Diagnosis not present

## 2017-08-01 DIAGNOSIS — E119 Type 2 diabetes mellitus without complications: Secondary | ICD-10-CM | POA: Diagnosis not present

## 2017-08-06 DIAGNOSIS — L308 Other specified dermatitis: Secondary | ICD-10-CM | POA: Diagnosis not present

## 2017-08-06 DIAGNOSIS — D692 Other nonthrombocytopenic purpura: Secondary | ICD-10-CM | POA: Diagnosis not present

## 2017-08-06 DIAGNOSIS — I10 Essential (primary) hypertension: Secondary | ICD-10-CM | POA: Diagnosis not present

## 2017-08-06 DIAGNOSIS — Z6833 Body mass index (BMI) 33.0-33.9, adult: Secondary | ICD-10-CM | POA: Diagnosis not present

## 2017-08-06 DIAGNOSIS — M109 Gout, unspecified: Secondary | ICD-10-CM | POA: Diagnosis not present

## 2017-08-06 DIAGNOSIS — E1165 Type 2 diabetes mellitus with hyperglycemia: Secondary | ICD-10-CM | POA: Diagnosis not present

## 2017-09-30 DIAGNOSIS — Z96653 Presence of artificial knee joint, bilateral: Secondary | ICD-10-CM | POA: Diagnosis not present

## 2017-10-08 DIAGNOSIS — E1151 Type 2 diabetes mellitus with diabetic peripheral angiopathy without gangrene: Secondary | ICD-10-CM | POA: Diagnosis not present

## 2017-10-08 DIAGNOSIS — L602 Onychogryphosis: Secondary | ICD-10-CM | POA: Diagnosis not present

## 2017-10-24 DIAGNOSIS — Z23 Encounter for immunization: Secondary | ICD-10-CM | POA: Diagnosis not present

## 2017-10-29 ENCOUNTER — Other Ambulatory Visit: Payer: Self-pay | Admitting: Internal Medicine

## 2017-10-29 DIAGNOSIS — Z1231 Encounter for screening mammogram for malignant neoplasm of breast: Secondary | ICD-10-CM

## 2017-11-05 DIAGNOSIS — I10 Essential (primary) hypertension: Secondary | ICD-10-CM | POA: Diagnosis not present

## 2017-11-05 DIAGNOSIS — Z6833 Body mass index (BMI) 33.0-33.9, adult: Secondary | ICD-10-CM | POA: Diagnosis not present

## 2017-11-05 DIAGNOSIS — D692 Other nonthrombocytopenic purpura: Secondary | ICD-10-CM | POA: Diagnosis not present

## 2017-11-05 DIAGNOSIS — E7849 Other hyperlipidemia: Secondary | ICD-10-CM | POA: Diagnosis not present

## 2017-11-05 DIAGNOSIS — E1165 Type 2 diabetes mellitus with hyperglycemia: Secondary | ICD-10-CM | POA: Diagnosis not present

## 2017-11-28 ENCOUNTER — Ambulatory Visit
Admission: RE | Admit: 2017-11-28 | Discharge: 2017-11-28 | Disposition: A | Discharge status: Home / Self Care | Payer: Medicare Other | Source: Ambulatory Visit | Attending: Internal Medicine | Admitting: Internal Medicine

## 2017-11-28 DIAGNOSIS — Z1231 Encounter for screening mammogram for malignant neoplasm of breast: Secondary | ICD-10-CM

## 2017-12-23 DIAGNOSIS — M545 Low back pain: Secondary | ICD-10-CM | POA: Diagnosis not present

## 2017-12-23 DIAGNOSIS — M5416 Radiculopathy, lumbar region: Secondary | ICD-10-CM | POA: Diagnosis not present

## 2017-12-23 DIAGNOSIS — M48061 Spinal stenosis, lumbar region without neurogenic claudication: Secondary | ICD-10-CM | POA: Diagnosis not present

## 2017-12-24 DIAGNOSIS — M109 Gout, unspecified: Secondary | ICD-10-CM | POA: Diagnosis not present

## 2017-12-24 DIAGNOSIS — I10 Essential (primary) hypertension: Secondary | ICD-10-CM | POA: Diagnosis not present

## 2017-12-24 DIAGNOSIS — R82998 Other abnormal findings in urine: Secondary | ICD-10-CM | POA: Diagnosis not present

## 2017-12-24 DIAGNOSIS — E7849 Other hyperlipidemia: Secondary | ICD-10-CM | POA: Diagnosis not present

## 2017-12-24 DIAGNOSIS — E1165 Type 2 diabetes mellitus with hyperglycemia: Secondary | ICD-10-CM | POA: Diagnosis not present

## 2017-12-25 DIAGNOSIS — R946 Abnormal results of thyroid function studies: Secondary | ICD-10-CM | POA: Diagnosis not present

## 2017-12-31 DIAGNOSIS — Z6833 Body mass index (BMI) 33.0-33.9, adult: Secondary | ICD-10-CM | POA: Diagnosis not present

## 2017-12-31 DIAGNOSIS — Z0000 Encounter for general adult medical examination without abnormal findings: Secondary | ICD-10-CM | POA: Diagnosis not present

## 2017-12-31 DIAGNOSIS — Z1389 Encounter for screening for other disorder: Secondary | ICD-10-CM | POA: Diagnosis not present

## 2017-12-31 DIAGNOSIS — E7849 Other hyperlipidemia: Secondary | ICD-10-CM | POA: Diagnosis not present

## 2017-12-31 DIAGNOSIS — I10 Essential (primary) hypertension: Secondary | ICD-10-CM | POA: Diagnosis not present

## 2017-12-31 DIAGNOSIS — L900 Lichen sclerosus et atrophicus: Secondary | ICD-10-CM | POA: Diagnosis not present

## 2017-12-31 DIAGNOSIS — G458 Other transient cerebral ischemic attacks and related syndromes: Secondary | ICD-10-CM | POA: Diagnosis not present

## 2017-12-31 DIAGNOSIS — M109 Gout, unspecified: Secondary | ICD-10-CM | POA: Diagnosis not present

## 2017-12-31 DIAGNOSIS — D692 Other nonthrombocytopenic purpura: Secondary | ICD-10-CM | POA: Diagnosis not present

## 2017-12-31 DIAGNOSIS — L602 Onychogryphosis: Secondary | ICD-10-CM | POA: Diagnosis not present

## 2017-12-31 DIAGNOSIS — E1165 Type 2 diabetes mellitus with hyperglycemia: Secondary | ICD-10-CM | POA: Diagnosis not present

## 2017-12-31 DIAGNOSIS — E1151 Type 2 diabetes mellitus with diabetic peripheral angiopathy without gangrene: Secondary | ICD-10-CM | POA: Diagnosis not present

## 2018-01-01 DIAGNOSIS — Z1212 Encounter for screening for malignant neoplasm of rectum: Secondary | ICD-10-CM | POA: Diagnosis not present

## 2018-01-13 DIAGNOSIS — M48061 Spinal stenosis, lumbar region without neurogenic claudication: Secondary | ICD-10-CM | POA: Diagnosis not present

## 2018-01-13 DIAGNOSIS — M545 Low back pain: Secondary | ICD-10-CM | POA: Diagnosis not present

## 2018-02-21 DIAGNOSIS — M5416 Radiculopathy, lumbar region: Secondary | ICD-10-CM | POA: Diagnosis not present

## 2018-02-21 DIAGNOSIS — M48061 Spinal stenosis, lumbar region without neurogenic claudication: Secondary | ICD-10-CM | POA: Diagnosis not present

## 2018-02-21 DIAGNOSIS — M545 Low back pain: Secondary | ICD-10-CM | POA: Diagnosis not present

## 2018-03-13 DIAGNOSIS — M5416 Radiculopathy, lumbar region: Secondary | ICD-10-CM | POA: Diagnosis not present

## 2018-03-13 DIAGNOSIS — M48061 Spinal stenosis, lumbar region without neurogenic claudication: Secondary | ICD-10-CM | POA: Diagnosis not present

## 2018-03-13 DIAGNOSIS — M545 Low back pain: Secondary | ICD-10-CM | POA: Diagnosis not present

## 2018-04-01 DIAGNOSIS — E1151 Type 2 diabetes mellitus with diabetic peripheral angiopathy without gangrene: Secondary | ICD-10-CM | POA: Diagnosis not present

## 2018-04-01 DIAGNOSIS — L602 Onychogryphosis: Secondary | ICD-10-CM | POA: Diagnosis not present

## 2018-04-03 DIAGNOSIS — I10 Essential (primary) hypertension: Secondary | ICD-10-CM | POA: Diagnosis not present

## 2018-04-03 DIAGNOSIS — E1165 Type 2 diabetes mellitus with hyperglycemia: Secondary | ICD-10-CM | POA: Diagnosis not present

## 2018-04-03 DIAGNOSIS — H9190 Unspecified hearing loss, unspecified ear: Secondary | ICD-10-CM | POA: Diagnosis not present

## 2018-06-03 DIAGNOSIS — L602 Onychogryphosis: Secondary | ICD-10-CM | POA: Diagnosis not present

## 2018-06-03 DIAGNOSIS — E1151 Type 2 diabetes mellitus with diabetic peripheral angiopathy without gangrene: Secondary | ICD-10-CM | POA: Diagnosis not present

## 2018-06-27 DIAGNOSIS — E1165 Type 2 diabetes mellitus with hyperglycemia: Secondary | ICD-10-CM | POA: Diagnosis not present

## 2018-07-01 DIAGNOSIS — E1169 Type 2 diabetes mellitus with other specified complication: Secondary | ICD-10-CM | POA: Diagnosis not present

## 2018-07-01 DIAGNOSIS — E785 Hyperlipidemia, unspecified: Secondary | ICD-10-CM | POA: Diagnosis not present

## 2018-07-01 DIAGNOSIS — M5430 Sciatica, unspecified side: Secondary | ICD-10-CM | POA: Diagnosis not present

## 2018-07-24 DIAGNOSIS — H903 Sensorineural hearing loss, bilateral: Secondary | ICD-10-CM | POA: Diagnosis not present

## 2018-07-24 DIAGNOSIS — Z77122 Contact with and (suspected) exposure to noise: Secondary | ICD-10-CM | POA: Diagnosis not present

## 2018-08-07 DIAGNOSIS — Z961 Presence of intraocular lens: Secondary | ICD-10-CM | POA: Diagnosis not present

## 2018-08-07 DIAGNOSIS — E119 Type 2 diabetes mellitus without complications: Secondary | ICD-10-CM | POA: Diagnosis not present

## 2018-08-07 DIAGNOSIS — H52203 Unspecified astigmatism, bilateral: Secondary | ICD-10-CM | POA: Diagnosis not present

## 2018-08-19 DIAGNOSIS — E1151 Type 2 diabetes mellitus with diabetic peripheral angiopathy without gangrene: Secondary | ICD-10-CM | POA: Diagnosis not present

## 2018-08-19 DIAGNOSIS — L602 Onychogryphosis: Secondary | ICD-10-CM | POA: Diagnosis not present

## 2018-09-18 DIAGNOSIS — Z23 Encounter for immunization: Secondary | ICD-10-CM | POA: Diagnosis not present

## 2018-11-11 DIAGNOSIS — E1151 Type 2 diabetes mellitus with diabetic peripheral angiopathy without gangrene: Secondary | ICD-10-CM | POA: Diagnosis not present

## 2018-11-11 DIAGNOSIS — L602 Onychogryphosis: Secondary | ICD-10-CM | POA: Diagnosis not present

## 2018-12-30 DIAGNOSIS — E7849 Other hyperlipidemia: Secondary | ICD-10-CM | POA: Diagnosis not present

## 2018-12-30 DIAGNOSIS — M109 Gout, unspecified: Secondary | ICD-10-CM | POA: Diagnosis not present

## 2018-12-30 DIAGNOSIS — E1165 Type 2 diabetes mellitus with hyperglycemia: Secondary | ICD-10-CM | POA: Diagnosis not present

## 2019-01-02 DIAGNOSIS — R82998 Other abnormal findings in urine: Secondary | ICD-10-CM | POA: Diagnosis not present

## 2019-01-02 DIAGNOSIS — E1165 Type 2 diabetes mellitus with hyperglycemia: Secondary | ICD-10-CM | POA: Diagnosis not present

## 2019-02-03 DIAGNOSIS — I10 Essential (primary) hypertension: Secondary | ICD-10-CM | POA: Diagnosis not present

## 2019-02-03 DIAGNOSIS — E119 Type 2 diabetes mellitus without complications: Secondary | ICD-10-CM | POA: Diagnosis not present

## 2019-02-08 ENCOUNTER — Ambulatory Visit: Payer: Medicare Other | Attending: Internal Medicine

## 2019-02-08 DIAGNOSIS — Z23 Encounter for immunization: Secondary | ICD-10-CM | POA: Insufficient documentation

## 2019-02-08 NOTE — Progress Notes (Signed)
   Covid-19 Vaccination Clinic  Name:  Brandi Beck    MRN: [id number] DOB: Jun 28, 1928  02/08/2019  Brandi Beck was observed post Covid-19 immunization for 15 minutes without incidence. She was provided with Vaccine Information Sheet and instruction to access the V-Safe system.   Brandi Beck was instructed to call 911 with any severe reactions post vaccine: Marland Kitchen Difficulty breathing  . Swelling of your face and throat  . A fast heartbeat  . A bad rash all over your body  . Dizziness and weakness

## 2019-02-10 DIAGNOSIS — I10 Essential (primary) hypertension: Secondary | ICD-10-CM | POA: Diagnosis not present

## 2019-02-10 DIAGNOSIS — E119 Type 2 diabetes mellitus without complications: Secondary | ICD-10-CM | POA: Diagnosis not present

## 2019-02-27 ENCOUNTER — Ambulatory Visit: Payer: Medicare Other | Attending: Internal Medicine

## 2019-02-27 DIAGNOSIS — Z23 Encounter for immunization: Secondary | ICD-10-CM | POA: Insufficient documentation

## 2019-02-27 NOTE — Progress Notes (Signed)
   Covid-19 Vaccination Clinic  Name:  Brandi Beck    MRN: [id number] DOB: 10-09-1928  02/27/2019  [name] was observed post Covid-19 immunization for 15 minutes without incidence. She was provided with Vaccine Information Sheet and instruction to access the V-Safe system.   [name] was instructed to call 911 with any severe reactions post vaccine: Marland Kitchen Difficulty breathing  . Swelling of your face and throat  . A fast heartbeat  . A bad rash all over your body  . Dizziness and weakness    Immunizations Administered    Name Date Dose VIS Date Route   Pfizer COVID-19 Vaccine 02/27/2019 10:23 AM 0.3 mL 01/02/2019 Intramuscular   Manufacturer: [employer]   Lot: [id number]   [place]: [id number]

## 2019-04-07 DIAGNOSIS — S92911A Unspecified fracture of right toe(s), initial encounter for closed fracture: Secondary | ICD-10-CM | POA: Diagnosis not present

## 2019-04-07 DIAGNOSIS — E1151 Type 2 diabetes mellitus with diabetic peripheral angiopathy without gangrene: Secondary | ICD-10-CM | POA: Diagnosis not present

## 2019-04-07 DIAGNOSIS — L602 Onychogryphosis: Secondary | ICD-10-CM | POA: Diagnosis not present

## 2019-04-19 DIAGNOSIS — Z20822 Contact with and (suspected) exposure to covid-19: Secondary | ICD-10-CM | POA: Diagnosis not present

## 2019-05-11 DIAGNOSIS — M545 Low back pain: Secondary | ICD-10-CM | POA: Diagnosis not present

## 2019-05-11 DIAGNOSIS — M48061 Spinal stenosis, lumbar region without neurogenic claudication: Secondary | ICD-10-CM | POA: Diagnosis not present

## 2019-05-11 DIAGNOSIS — M5416 Radiculopathy, lumbar region: Secondary | ICD-10-CM | POA: Diagnosis not present

## 2019-05-12 DIAGNOSIS — S92911D Unspecified fracture of right toe(s), subsequent encounter for fracture with routine healing: Secondary | ICD-10-CM | POA: Diagnosis not present

## 2019-05-12 DIAGNOSIS — M2041 Other hammer toe(s) (acquired), right foot: Secondary | ICD-10-CM | POA: Diagnosis not present

## 2019-05-12 DIAGNOSIS — M792 Neuralgia and neuritis, unspecified: Secondary | ICD-10-CM | POA: Diagnosis not present

## 2019-05-12 DIAGNOSIS — M2042 Other hammer toe(s) (acquired), left foot: Secondary | ICD-10-CM | POA: Diagnosis not present

## 2019-05-25 DIAGNOSIS — I10 Essential (primary) hypertension: Secondary | ICD-10-CM | POA: Diagnosis not present

## 2019-05-25 DIAGNOSIS — M546 Pain in thoracic spine: Secondary | ICD-10-CM | POA: Diagnosis not present

## 2019-05-25 DIAGNOSIS — E1165 Type 2 diabetes mellitus with hyperglycemia: Secondary | ICD-10-CM | POA: Diagnosis not present

## 2019-05-25 DIAGNOSIS — E785 Hyperlipidemia, unspecified: Secondary | ICD-10-CM | POA: Diagnosis not present

## 2019-05-25 DIAGNOSIS — M109 Gout, unspecified: Secondary | ICD-10-CM | POA: Diagnosis not present

## 2019-07-07 DIAGNOSIS — L602 Onychogryphosis: Secondary | ICD-10-CM | POA: Diagnosis not present

## 2019-07-07 DIAGNOSIS — E1151 Type 2 diabetes mellitus with diabetic peripheral angiopathy without gangrene: Secondary | ICD-10-CM | POA: Diagnosis not present

## 2019-08-03 DIAGNOSIS — M48061 Spinal stenosis, lumbar region without neurogenic claudication: Secondary | ICD-10-CM | POA: Diagnosis not present

## 2019-08-03 DIAGNOSIS — M5416 Radiculopathy, lumbar region: Secondary | ICD-10-CM | POA: Diagnosis not present

## 2019-08-03 DIAGNOSIS — M545 Low back pain: Secondary | ICD-10-CM | POA: Diagnosis not present

## 2019-08-10 DIAGNOSIS — E1165 Type 2 diabetes mellitus with hyperglycemia: Secondary | ICD-10-CM | POA: Diagnosis not present

## 2019-08-10 DIAGNOSIS — R197 Diarrhea, unspecified: Secondary | ICD-10-CM | POA: Diagnosis not present

## 2019-08-13 DIAGNOSIS — H26492 Other secondary cataract, left eye: Secondary | ICD-10-CM | POA: Diagnosis not present

## 2019-08-13 DIAGNOSIS — H5203 Hypermetropia, bilateral: Secondary | ICD-10-CM | POA: Diagnosis not present

## 2019-08-13 DIAGNOSIS — Z961 Presence of intraocular lens: Secondary | ICD-10-CM | POA: Diagnosis not present

## 2019-08-13 DIAGNOSIS — E119 Type 2 diabetes mellitus without complications: Secondary | ICD-10-CM | POA: Diagnosis not present

## 2019-09-18 DIAGNOSIS — Z20828 Contact with and (suspected) exposure to other viral communicable diseases: Secondary | ICD-10-CM | POA: Diagnosis not present

## 2019-09-21 DIAGNOSIS — R197 Diarrhea, unspecified: Secondary | ICD-10-CM | POA: Diagnosis not present

## 2019-09-21 DIAGNOSIS — I10 Essential (primary) hypertension: Secondary | ICD-10-CM | POA: Diagnosis not present

## 2019-09-21 DIAGNOSIS — E1169 Type 2 diabetes mellitus with other specified complication: Secondary | ICD-10-CM | POA: Diagnosis not present

## 2019-09-21 DIAGNOSIS — E785 Hyperlipidemia, unspecified: Secondary | ICD-10-CM | POA: Diagnosis not present

## 2019-10-12 DIAGNOSIS — Z23 Encounter for immunization: Secondary | ICD-10-CM | POA: Diagnosis not present

## 2019-10-21 DIAGNOSIS — L602 Onychogryphosis: Secondary | ICD-10-CM | POA: Diagnosis not present

## 2019-10-21 DIAGNOSIS — E1151 Type 2 diabetes mellitus with diabetic peripheral angiopathy without gangrene: Secondary | ICD-10-CM | POA: Diagnosis not present

## 2019-11-24 DIAGNOSIS — M48061 Spinal stenosis, lumbar region without neurogenic claudication: Secondary | ICD-10-CM | POA: Diagnosis not present

## 2019-11-24 DIAGNOSIS — M545 Low back pain, unspecified: Secondary | ICD-10-CM | POA: Diagnosis not present

## 2019-11-24 DIAGNOSIS — M791 Myalgia, unspecified site: Secondary | ICD-10-CM | POA: Diagnosis not present

## 2020-01-12 DIAGNOSIS — B351 Tinea unguium: Secondary | ICD-10-CM | POA: Diagnosis not present

## 2020-01-12 DIAGNOSIS — B353 Tinea pedis: Secondary | ICD-10-CM | POA: Diagnosis not present

## 2020-01-12 DIAGNOSIS — M79674 Pain in right toe(s): Secondary | ICD-10-CM | POA: Diagnosis not present

## 2020-01-12 DIAGNOSIS — E1151 Type 2 diabetes mellitus with diabetic peripheral angiopathy without gangrene: Secondary | ICD-10-CM | POA: Diagnosis not present

## 2020-01-12 DIAGNOSIS — L602 Onychogryphosis: Secondary | ICD-10-CM | POA: Diagnosis not present

## 2020-01-26 DIAGNOSIS — R82998 Other abnormal findings in urine: Secondary | ICD-10-CM | POA: Diagnosis not present

## 2020-01-26 DIAGNOSIS — E1169 Type 2 diabetes mellitus with other specified complication: Secondary | ICD-10-CM | POA: Diagnosis not present

## 2020-01-26 DIAGNOSIS — M109 Gout, unspecified: Secondary | ICD-10-CM | POA: Diagnosis not present

## 2020-01-26 DIAGNOSIS — E785 Hyperlipidemia, unspecified: Secondary | ICD-10-CM | POA: Diagnosis not present

## 2020-01-29 DIAGNOSIS — N3946 Mixed incontinence: Secondary | ICD-10-CM | POA: Diagnosis not present

## 2020-01-29 DIAGNOSIS — I10 Essential (primary) hypertension: Secondary | ICD-10-CM | POA: Diagnosis not present

## 2020-01-29 DIAGNOSIS — M109 Gout, unspecified: Secondary | ICD-10-CM | POA: Diagnosis not present

## 2020-01-29 DIAGNOSIS — D692 Other nonthrombocytopenic purpura: Secondary | ICD-10-CM | POA: Diagnosis not present

## 2020-01-29 DIAGNOSIS — Z0000 Encounter for general adult medical examination without abnormal findings: Secondary | ICD-10-CM | POA: Diagnosis not present

## 2020-01-29 DIAGNOSIS — E785 Hyperlipidemia, unspecified: Secondary | ICD-10-CM | POA: Diagnosis not present

## 2020-01-29 DIAGNOSIS — M5430 Sciatica, unspecified side: Secondary | ICD-10-CM | POA: Diagnosis not present

## 2020-01-29 DIAGNOSIS — Z8673 Personal history of transient ischemic attack (TIA), and cerebral infarction without residual deficits: Secondary | ICD-10-CM | POA: Diagnosis not present

## 2020-01-29 DIAGNOSIS — E1169 Type 2 diabetes mellitus with other specified complication: Secondary | ICD-10-CM | POA: Diagnosis not present

## 2020-03-04 DIAGNOSIS — R42 Dizziness and giddiness: Secondary | ICD-10-CM | POA: Diagnosis not present

## 2020-03-04 DIAGNOSIS — I10 Essential (primary) hypertension: Secondary | ICD-10-CM | POA: Diagnosis not present

## 2020-03-04 DIAGNOSIS — Z8673 Personal history of transient ischemic attack (TIA), and cerebral infarction without residual deficits: Secondary | ICD-10-CM | POA: Diagnosis not present

## 2020-03-04 DIAGNOSIS — E1169 Type 2 diabetes mellitus with other specified complication: Secondary | ICD-10-CM | POA: Diagnosis not present

## 2020-03-18 DIAGNOSIS — H8110 Benign paroxysmal vertigo, unspecified ear: Secondary | ICD-10-CM | POA: Diagnosis not present

## 2020-03-18 DIAGNOSIS — Z8673 Personal history of transient ischemic attack (TIA), and cerebral infarction without residual deficits: Secondary | ICD-10-CM | POA: Diagnosis not present

## 2020-03-18 DIAGNOSIS — E1169 Type 2 diabetes mellitus with other specified complication: Secondary | ICD-10-CM | POA: Diagnosis not present

## 2020-03-18 DIAGNOSIS — E785 Hyperlipidemia, unspecified: Secondary | ICD-10-CM | POA: Diagnosis not present

## 2020-03-18 DIAGNOSIS — I10 Essential (primary) hypertension: Secondary | ICD-10-CM | POA: Diagnosis not present

## 2020-03-18 DIAGNOSIS — R42 Dizziness and giddiness: Secondary | ICD-10-CM | POA: Diagnosis not present

## 2020-03-23 ENCOUNTER — Ambulatory Visit: Payer: Medicare Other

## 2020-03-23 ENCOUNTER — Other Ambulatory Visit: Payer: Self-pay

## 2020-03-23 ENCOUNTER — Ambulatory Visit: Payer: Medicare Other | Attending: Internal Medicine | Admitting: Physical Therapy

## 2020-03-23 DIAGNOSIS — R42 Dizziness and giddiness: Secondary | ICD-10-CM | POA: Insufficient documentation

## 2020-03-23 NOTE — Patient Instructions (Signed)
Benign Positional Vertigo Vertigo is the feeling that you or your surroundings are moving when they are not. Benign positional vertigo is the most common form of vertigo. This is usually a harmless condition (benign). This condition is positional. This means that symptoms are triggered by certain movements and positions. This condition can be dangerous if it occurs while you are doing something that could cause harm to you or others. This includes activities such as driving or operating machinery. What are the causes? The inner ear has fluid-filled canals that help your brain sense movement and balance. When the fluid moves, the brain receives messages about your body's position. With benign positional vertigo, crystals in the inner ear break free and disturb the inner ear area. This causes your brain to receive confusing messages about your body's position. What increases the risk? You are more likely to develop this condition if:  You are a woman.  You are 41 years of age or older.  You have recently had a head injury.  You have an inner ear disease. What are the signs or symptoms? Symptoms of this condition usually happen when you move your head or your eyes in different directions. Symptoms may start suddenly, and usually last for less than a minute. They include:  Loss of balance and falling.  Feeling like you are spinning or moving.  Feeling like your surroundings are spinning or moving.  Nausea and vomiting.  Blurred vision.  Dizziness.  Involuntary eye movement (nystagmus). Symptoms can be mild and cause only minor problems, or they can be severe and interfere with daily life. Episodes of benign positional vertigo may return (recur) over time. Symptoms may improve over time. How is this diagnosed? This condition may be diagnosed based on:  Your medical history.  Physical exam of the head, neck, and ears.  Positional tests to check for or stimulate vertigo. You may  be asked to turn your head and change positions, such as going from sitting to lying down. A health care provider will watch for symptoms of vertigo. You may be referred to a health care provider who specializes in ear, nose, and throat problems (ENT, or otolaryngologist) or a provider who specializes in disorders of the nervous system (neurologist). How is this treated? This condition may be treated in a session in which your health care provider moves your head in specific positions to help the displaced crystals in your inner ear move. Treatment for this condition may take several sessions. Surgery may be needed in severe cases, but this is rare. In some cases, benign positional vertigo may resolve on its own in 2-4 weeks.   Follow these instructions at home: Safety  Move slowly. Avoid sudden body or head movements or certain positions, as told by your health care provider.  Avoid driving until your health care provider says it is safe for you to do so.  Avoid operating heavy machinery until your health care provider says it is safe for you to do so.  Avoid doing any tasks that would be dangerous to you or others if vertigo occurs.  If you have trouble walking or keeping your balance, try using a cane for stability. If you feel dizzy or unstable, sit down right away.  Return to your normal activities as told by your health care provider. Ask your health care provider what activities are safe for you. General instructions  Take over-the-counter and prescription medicines only as told by your health care provider.  Drink enough  fluid to keep your urine pale yellow.  Keep all follow-up visits as told by your health care provider. This is important. Contact a health care provider if:  You have a fever.  Your condition gets worse or you develop new symptoms.  Your family or friends notice any behavioral changes.  You have nausea or vomiting that gets worse.  You have numbness or a  prickling and tingling sensation. Get help right away if you:  Have difficulty speaking or moving.  Are always dizzy.  Faint.  Develop severe headaches.  Have weakness in your legs or arms.  Have changes in your hearing or vision.  Develop a stiff neck.  Develop sensitivity to light. Summary  Vertigo is the feeling that you or your surroundings are moving when they are not. Benign positional vertigo is the most common form of vertigo.  This condition is caused by crystals in the inner ear that become displaced. This causes a disturbance in an area of the inner ear that helps your brain sense movement and balance.  Symptoms include loss of balance and falling, feeling that you or your surroundings are moving, nausea and vomiting, and blurred vision.  This condition can be diagnosed based on symptoms, a physical exam, and positional tests.  Follow safety instructions as told by your health care provider. You will also be told when to contact your health care provider in case of problems. This information is not intended to replace advice given to you by your health care provider. Make sure you discuss any questions you have with your health care provider. Document Revised: 12/02/2018 Document Reviewed: 06/19/2017 Elsevier Patient Education  2021 [place] to Side-Lying    Sit on edge of bed. 1. Turn head 45 to right. 2. Maintain head position and lie down slowly on left side. Hold until symptoms subside. 3. Sit up slowly. Hold until symptoms subside. 4. Turn head 45 to left. 5. Maintain head position and lie down slowly on right side. Hold until symptoms subside. 6. Sit up slowly. Repeat sequence __5__ times per session. Do __2-3__ sessions per day.  Copyright  VHI. All rights reserved.

## 2020-03-24 ENCOUNTER — Encounter: Payer: Self-pay | Admitting: Physical Therapy

## 2020-03-24 NOTE — Therapy (Signed)
[facility] [address] [place], [place], [address] Phone: [phone]   Fax:  [phone]  Physical Therapy Evaluation  Patient Details  Name: Brandi Beck MRN: [id number] Date of Birth: 05/27/1928 Referring Provider (PT): Reginold Agent, NP   Encounter Date: 03/23/2020   PT End of Session - 03/24/20 1958    Visit Number 1    Number of Visits 4    Date for PT Re-Evaluation 04/22/20    Authorization Type Medicare    Authorization Time Period 03-23-20 - 05-23-20    PT Start Time 1105    PT Stop Time 1150    PT Time Calculation (min) 45 min    Activity Tolerance Patient tolerated treatment well    Behavior During Therapy [facility] for tasks assessed/performed           Past Medical History:  Diagnosis Date  . Diabetes mellitus without complication ([place])   . Gout   . High cholesterol   . Hypertension     History reviewed. No pertinent surgical history.  There were no vitals filed for this visit.        [facility] PT Assessment - 03/24/20 0001      Assessment   Medical Diagnosis Vertigo    Referring Provider (PT) Reginold Agent, NP    Onset Date/Surgical Date --   mid Jan. 2022   Prior Therapy None      Precautions   Precautions None      Balance Screen   Has the patient fallen in the past 6 months No    Has the patient had a decrease in activity level because of a fear of falling?  No    Is the patient reluctant to leave their home because of a fear of falling?  No      Prior Function   Level of Independence Requires assistive device for independence;Independent with basic ADLs      Observation/Other Assessments   Focus on Therapeutic Outcomes (FOTO)  64/100 physical FS primary measure; risk adjusted 55/100   DPS 61.5                 Vestibular Assessment - 03/24/20 0001      Symptom Behavior   Subjective history of current problem pt states vertigo started approx. 6 weeks ago - occurs late  in evening when she is lying on sofa and goes to turn over    Type of Dizziness  Spinning    Frequency of Dizziness varies    Duration of Dizziness secs to minutes    Symptom Nature Positional    Aggravating Factors Rolling to left    Relieving Factors Head stationary;Lying supine    Progression of Symptoms Better      Oculomotor Exam   Oculomotor Alignment Normal    Spontaneous Absent    Smooth Pursuits Intact    Saccades Intact      Positional Testing   Dix-Hallpike Dix-Hallpike Right;Dix-Hallpike Left    Sidelying Test Sidelying Right;Sidelying Left      Dix-Hallpike Right   Dix-Hallpike Right Duration none    Dix-Hallpike Right Symptoms No nystagmus      Dix-Hallpike Left   Dix-Hallpike Left Duration none    Dix-Hallpike Left Symptoms No nystagmus      Sidelying Right   Sidelying Right Duration none    Sidelying Right Symptoms No nystagmus      Sidelying Left   Sidelying Left Duration none    Sidelying Left Symptoms  No nystagmus      Positional Sensitivities   Up from Right Hallpike Lightheadedness    Up from Left Hallpike Lightheadedness    Rolling Right No dizziness    Rolling Left No dizziness              Objective measurements completed on examination: See above findings.               PT Education - 03/24/20 1955    Education Details BPPV information, [place]    Person(s) Educated Patient    Methods Explanation;Demonstration;Handout    Comprehension Verbalized understanding;Returned demonstration               PT Long Term Goals - 03/24/20 2009      PT LONG TERM GOAL #1   Title Independent in HEP for vestibular exercises.    Time 4    Period Weeks    Status New    Target Date 04/22/20      PT LONG TERM GOAL #2   Title Increase Foto score from 64/100 to 74/100 to demo improvement in dizziness.    Time 4    Period Weeks    Status New    Target Date 04/22/20                  Plan - 03/24/20 2000     Clinical Impression Statement All positional testing was negative with no nystagmus noted and pt reporting no vertigo in any test position.  Symptoms of dizziness are not fully consistent with BPPV at this time.  Pt did report some light-headedness with return to sitting from Dix-Hallpike test positions but did not describe as spinning vertigo, but rather as light-headedness.  Will continue to assess in follow up session.    Personal Factors and Comorbidities Age;Comorbidity 2;Past/Current Experience    Comorbidities h/o TIA/CVA; controlled type 2 DM    Examination-Activity Limitations Locomotion Level;Transfers;Bed Mobility    Examination-Participation Restrictions Meal Prep;Cleaning;Community Activity    Stability/Clinical Decision Making Evolving/Moderate complexity    Clinical Decision Making Moderate    Rehab Potential Good    PT Frequency 1x / week    PT Duration 4 weeks    PT Treatment/Interventions Vestibular;Canalith Repostioning;Patient/family education;Therapeutic exercise;Therapeutic activities;Gait training;Balance training;Neuromuscular re-education;ADLs/Self Care Home Management    PT Next Visit Plan recheck BPPV (Lt?)    PT Home Exercise Plan [place] given per pt's request    Consulted and Agree with Plan of Care Patient           Patient will benefit from skilled therapeutic intervention in order to improve the following deficits and impairments:  Dizziness,Decreased balance  Visit Diagnosis: Dizziness and giddiness - Plan: PT plan of care cert/re-cert     Problem List Patient Active Problem List   Diagnosis Date Noted  . Near syncope 08/24/2014  . Essential hypertension 08/24/2014  . Dyslipidemia 08/24/2014  . Fracture of rib of right side 08/24/2014  . Controlled diabetes mellitus type II without complication ([place]) 88/41/6606    Brandi Beck, [place], PT 03/24/2020, 8:15 PM  [facility] [address] [place], [place], [address] Phone: [phone]   Fax:  [phone]  Name: Brandi Beck MRN: [id number] Date of Birth: 27-Feb-1928

## 2020-03-29 ENCOUNTER — Ambulatory Visit: Payer: Medicare Other | Admitting: Physical Therapy

## 2020-03-29 ENCOUNTER — Other Ambulatory Visit: Payer: Self-pay

## 2020-03-29 VITALS — BP 109/68 | HR 93

## 2020-03-29 DIAGNOSIS — R42 Dizziness and giddiness: Secondary | ICD-10-CM | POA: Diagnosis not present

## 2020-03-29 NOTE — Patient Instructions (Signed)
  Gaze Stabilization: Tip Card  1.Target must remain in focus, not blurry, and appear stationary while head is in motion. 2.Perform exercises with small head movements (45 to either side of midline). 3.Increase speed of head motion so long as target is in focus. 4.If you wear eyeglasses, be sure you can see target through lens (therapist will give specific instructions for bifocal / progressive lenses). 5.These exercises may provoke dizziness or nausea. Work through these symptoms. If too dizzy, slow head movement slightly. Rest between each exercise. 6.Exercises demand concentration; avoid distractions. 7.For safety, perform standing exercises close to a counter, wall, corner, or next to someone.     Gaze Stabilization: Sitting    Keeping eyes on target on wall \\_10N  feet away, tilt head down 15-30 and move head side to side for _60___ seconds. Repeat while moving head up and down for __60__ seconds Do exercise if you have an episode of vertigo.

## 2020-03-30 NOTE — Therapy (Signed)
[facility] [address] [place], [place], [address] Phone: [phone]   Fax:  [phone]  Physical Therapy Treatment  Patient Details  Name: Brandi Beck MRN: [id number] Date of Birth: July 24, 1928 Referring Provider (PT): Reginold Agent, NP   Encounter Date: 03/29/2020   PT End of Session - 03/30/20 2154    Visit Number 2    Number of Visits 4    Date for PT Re-Evaluation 04/22/20    Authorization Type Medicare    Authorization Time Period 03-23-20 - 05-23-20    PT Start Time 1448    PT Stop Time 1532    PT Time Calculation (min) 44 min    Activity Tolerance Patient tolerated treatment well    Behavior During Therapy [facility] for tasks assessed/performed           Past Medical History:  Diagnosis Date  . Diabetes mellitus without complication ([place])   . Gout   . High cholesterol   . Hypertension     No past surgical history on file.  Vitals:   03/29/20 1513 03/29/20 1515  BP: 117/76 109/68  Pulse: 90 93     Subjective Assessment - 03/29/20 1456    Subjective Pt states the dizziness was the worst yesterday that it has been    Pertinent History h/o TIA & stroke: controlled type 2 DM    Patient Stated Goals resolve the vertigo    Currently in Pain? No/denies                                          PT Long Term Goals - 03/30/20 2156      PT LONG TERM GOAL #1   Title Independent in HEP for vestibular exercises.    Time 4    Period Weeks    Status New      PT LONG TERM GOAL #2   Title Increase Foto score from 64/100 to 74/100 to demo improvement in dizziness.    Time 4    Period Weeks    Status New                 Plan - 03/30/20 2155    Personal Factors and Comorbidities Age;Comorbidity 2;Past/Current Experience    Comorbidities h/o TIA/CVA; controlled type 2 DM    Examination-Activity Limitations Locomotion Level;Transfers;Bed Mobility     Examination-Participation Restrictions Meal Prep;Cleaning;Community Activity    Stability/Clinical Decision Making Evolving/Moderate complexity    Rehab Potential Good    PT Frequency 1x / week    PT Duration 4 weeks    PT Treatment/Interventions Vestibular;Canalith Repostioning;Patient/family education;Therapeutic exercise;Therapeutic activities;Gait training;Balance training;Neuromuscular re-education;ADLs/Self Care Home Management    PT Next Visit Plan recheck BPPV (Lt?)    PT Home Exercise Plan [place] given per pt's request    Consulted and Agree with Plan of Care Patient           Patient will benefit from skilled therapeutic intervention in order to improve the following deficits and impairments:  Dizziness,Decreased balance  Visit Diagnosis: Dizziness and giddiness     Problem List Patient Active Problem List   Diagnosis Date Noted  . Near syncope 08/24/2014  . Essential hypertension 08/24/2014  . Dyslipidemia 08/24/2014  . Fracture of rib of right side 08/24/2014  . Controlled diabetes mellitus type II without complication ([place]) 69/62/9528    Tesia Lybrand, [place],  PT 03/30/2020, 9:57 PM  [facility] [address] [place], [place], [address] Phone: [phone]   Fax:  [phone]  Name: Lakindra Wible MRN: [id number] Date of Birth: April 25, 1928

## 2020-04-04 ENCOUNTER — Other Ambulatory Visit: Payer: Self-pay | Admitting: Internal Medicine

## 2020-04-04 DIAGNOSIS — R42 Dizziness and giddiness: Secondary | ICD-10-CM

## 2020-04-04 DIAGNOSIS — M542 Cervicalgia: Secondary | ICD-10-CM

## 2020-04-04 DIAGNOSIS — Z8673 Personal history of transient ischemic attack (TIA), and cerebral infarction without residual deficits: Secondary | ICD-10-CM

## 2020-04-05 ENCOUNTER — Ambulatory Visit: Payer: Medicare Other | Admitting: Physical Therapy

## 2020-04-05 ENCOUNTER — Ambulatory Visit
Admission: RE | Admit: 2020-04-05 | Discharge: 2020-04-05 | Disposition: A | Discharge status: Home / Self Care | Payer: Medicare Other | Source: Ambulatory Visit | Attending: Internal Medicine | Admitting: Internal Medicine

## 2020-04-05 ENCOUNTER — Other Ambulatory Visit: Payer: Self-pay

## 2020-04-05 DIAGNOSIS — R42 Dizziness and giddiness: Secondary | ICD-10-CM

## 2020-04-05 DIAGNOSIS — M542 Cervicalgia: Secondary | ICD-10-CM

## 2020-04-05 DIAGNOSIS — J322 Chronic ethmoidal sinusitis: Secondary | ICD-10-CM | POA: Diagnosis not present

## 2020-04-05 DIAGNOSIS — M47812 Spondylosis without myelopathy or radiculopathy, cervical region: Secondary | ICD-10-CM | POA: Diagnosis not present

## 2020-04-05 DIAGNOSIS — R200 Anesthesia of skin: Secondary | ICD-10-CM | POA: Diagnosis not present

## 2020-04-05 DIAGNOSIS — G319 Degenerative disease of nervous system, unspecified: Secondary | ICD-10-CM | POA: Diagnosis not present

## 2020-04-05 DIAGNOSIS — I6782 Cerebral ischemia: Secondary | ICD-10-CM | POA: Diagnosis not present

## 2020-04-05 DIAGNOSIS — Z8673 Personal history of transient ischemic attack (TIA), and cerebral infarction without residual deficits: Secondary | ICD-10-CM

## 2020-04-05 DIAGNOSIS — I6389 Other cerebral infarction: Secondary | ICD-10-CM | POA: Diagnosis not present

## 2020-04-05 DIAGNOSIS — G250 Essential tremor: Secondary | ICD-10-CM | POA: Diagnosis not present

## 2020-04-05 MED ORDER — GADOBENATE DIMEGLUMINE 529 MG/ML IV SOLN
17.0000 mL | Freq: Once | INTRAVENOUS | Status: AC | PRN
  Administered 2020-04-05: 17 mL via INTRAVENOUS

## 2020-04-12 ENCOUNTER — Ambulatory Visit (INDEPENDENT_AMBULATORY_CARE_PROVIDER_SITE_OTHER): Payer: Medicare Other | Admitting: Neurology

## 2020-04-12 ENCOUNTER — Encounter: Payer: Self-pay | Admitting: Neurology

## 2020-04-12 VITALS — BP 123/76 | HR 54 | Ht 60.0 in | Wt 160.3 lb

## 2020-04-12 DIAGNOSIS — R42 Dizziness and giddiness: Secondary | ICD-10-CM

## 2020-04-12 DIAGNOSIS — L84 Corns and callosities: Secondary | ICD-10-CM | POA: Diagnosis not present

## 2020-04-12 DIAGNOSIS — E1151 Type 2 diabetes mellitus with diabetic peripheral angiopathy without gangrene: Secondary | ICD-10-CM | POA: Diagnosis not present

## 2020-04-12 DIAGNOSIS — M79674 Pain in right toe(s): Secondary | ICD-10-CM | POA: Diagnosis not present

## 2020-04-12 DIAGNOSIS — L603 Nail dystrophy: Secondary | ICD-10-CM | POA: Diagnosis not present

## 2020-04-12 DIAGNOSIS — B351 Tinea unguium: Secondary | ICD-10-CM | POA: Diagnosis not present

## 2020-04-12 DIAGNOSIS — L602 Onychogryphosis: Secondary | ICD-10-CM | POA: Diagnosis not present

## 2020-04-12 NOTE — Progress Notes (Signed)
Subjective:    Patient ID: Brandi Beck is a 87 y.o. female.  HPI     Brandi Age, MD, PhD [facility] [address] [place], [place] [address]  Dear Dr. Ardeth Perfect,   I saw your patient, Brandi Beck, on your kind request in my neurologic clinic today for initial consultation of her dizziness.  The patient is accompanied by her husband today.  As you know, Brandi Beck is a 87 year old right-handed woman with an underlying medical history of gout, diabetes, chronic neck pain with status post neck surgery, history of TIA, history of stroke, hypertension, hyperlipidemia, and mild obesity, who reports an approximately 5 to 6-week history of intermittent dizziness.  She has noticed a brief sensation of feeling off balance or spinning sensation when she changes positions or turns her head.  Symptoms come and go, they seem to be less intense or severe or longer but may be more frequent over time.  I reviewed your office note from 03/18/2020.  She was referred to physical therapy and reports that she had 2 sessions, her symptoms were not reproduced and she did not benefit from therapy.  She has not fallen thankfully.  She uses a cane and also has a walker at the house which she kept by her bed but has not actually used it.  She had blood work through your office on 01/26/2020 and I was able to review the results: Glucose 154, creatinine 0.8, sodium 139, potassium 4.7, AST 10, ALT 15, alk phos 81, BUN 19.  CBC with differential was unremarkable, TSH was 0.88, A1c 6.5, lipid panel benign with the exception of triglycerides of 255 and HDL of 31, LDL was 49.  You ordered a brain and neck MRI.  She had a recent set of images.  She had a brain MRI and cervical spine MRI with and without contrast on 04/05/2020 and I reviewed the results:  IMPRESSION: 1. Distant ACDF C5 through C7 with a good appearance. 2. Degenerative changes above and below the fusion. No compressive canal  stenosis. Mild foraminal narrowing on the right at C2-3, on the right at C3-4, on the left at C4-5. Mild bilateral foraminal narrowing at T1-2.   IMPRESSION: Mildly motion degraded exam.   No evidence of acute intracranial abnormality, including acute infarction.   Chronic small-vessel infarcts within the bilateral basal ganglia and right thalamus.   Background mild-to-moderate cerebral white matter chronic small vessel ischemic disease.   Mild cerebral atrophy.  She tries to hydrate with water.  She estimates that she does not drink 6 cups of water per day, no alcohol, does not smoke.  She denies any sudden onset of one-sided weakness or numbness or tingling or droopy face or slurring of speech.  Of note, she has been on gabapentin 300 mg for back pain.  She has not had any recent back pain.  She tries to stay active and exercises on a recumbent bike/elliptical.  She has been on propranolol 20 mg twice daily.  She does not take any hydrocodone which was an old medicine on her list.  She has had a reduction in her Januvia to 50 mg daily.  She is no longer on Coreg.   She denies any recent neck pain.  She denies any recent exacerbation of her sciatica.  Her Past Medical History Is Significant For: Past Medical History:  Diagnosis Date  . Diabetes mellitus without complication ([place])   . Gout   . High cholesterol   .  Hypertension     Her Past Surgical History Is Significant For: No past surgical history on file.  Her Family History Is Significant For: Family History  Problem Relation Beck of Onset  . Kidney cancer Mother   . Breast cancer Neg Hx     Her Social History Is Significant For: Social History   Socioeconomic History  . Marital status: Married    Spouse name: Not on file  . Number of children: Not on file  . Years of education: Not on file  . Highest education level: Not on file  Occupational History  . Not on file  Tobacco Use  . Smoking status: Never Smoker   . Smokeless tobacco: Never Used  Substance and Sexual Activity  . Alcohol use: No  . Drug use: Not on file  . Sexual activity: Never  Other Topics Concern  . Not on file  Social History Narrative  . Not on file   Social Determinants of Health   Financial Resource Strain: Not on file  Food Insecurity: Not on file  Transportation Needs: Not on file  Physical Activity: Not on file  Stress: Not on file  Social Connections: Not on file    Her Allergies Are:  Allergies  Allergen Reactions  . Citrus Swelling and Cough    Swollen lips  . Peanuts [Peanut Oil] Swelling and Cough    Swollen lips  . Aspirin Rash  . Niacin And Related Rash  :   Her Current Medications Are:  Outpatient Encounter Medications as of 04/12/2020  Medication Sig  . acetaminophen (TYLENOL) 500 MG tablet Take 1,000 mg by mouth every 8 (eight) hours as needed for mild pain or moderate pain.  Marland Kitchen allopurinol (ZYLOPRIM) 100 MG tablet Take 200 mg by mouth daily.  Marland Kitchen atorvastatin (LIPITOR) 40 MG tablet Take 40 mg by mouth daily.  . carvedilol (COREG) 12.5 MG tablet Take 12.5 mg by mouth daily.  . clobetasol ointment (TEMOVATE) 2.77 % Apply 1 application topically daily as needed (sclerosis).  . clopidogrel (PLAVIX) 75 MG tablet Take 75 mg by mouth daily.  . fluocinonide cream (LIDEX) 8.24 % Apply 1 application topically daily as needed (rash).  . fluticasone (FLONASE) 50 MCG/ACT nasal spray Place 1 spray into both nostrils daily as needed for allergies or rhinitis.  Marland Kitchen HYDROcodone-acetaminophen (NORCO/VICODIN) 5-325 MG per tablet Take 1 tablet by mouth every 6 (six) hours as needed for moderate pain or severe pain.  Marland Kitchen losartan (COZAAR) 25 MG tablet Take 25 mg by mouth daily.  . metFORMIN (GLUCOPHAGE) 500 MG tablet Take 500 mg by mouth 2 (two) times daily with a meal.  . NIFEdipine (PROCARDIA-XL/ADALAT-CC/NIFEDICAL-XL) 30 MG 24 hr tablet Take 30 mg by mouth daily.  . Omega-3 Fatty Acids (FISH OIL PO) Take 1 capsule by  mouth daily.  . sitaGLIPtin (JANUVIA) 50 MG tablet Take 50 mg by mouth daily.  Marland Kitchen spironolactone (ALDACTONE) 25 MG tablet Take 25 mg by mouth daily.   No facility-administered encounter medications on file as of 04/12/2020.  :   Review of Systems:  Out of a complete 14 point review of systems, all are reviewed and negative with the exception of these symptoms as listed below:  Review of Systems  Neurological:       Here for consult on dizziness. Reports sx started 5-6 weeks ago. Notices increase in dizziness when she changes positions. Feels like it is worse at night. Denies any recent falls.     Objective:  Neurological Exam  Physical Exam Physical Examination:   Vitals:   04/12/20 1236  BP: 123/76  Pulse: (!) 54   On orthostatic testing, standing blood pressure was 119/76 with a pulse of 90.  She did not have any orthostatic lightheadedness or dizziness or vertiginous symptoms but reports that after she sat down again, she had a brief/fleeting spell of dizziness.  General Examination: The patient is a very pleasant 87 y.o. female in no acute distress. She appears well-developed and well-nourished and well groomed.   HEENT: Normocephalic, atraumatic, pupils are equal, round and reactive to light, no vertiginous symptoms with sudden head position changes, no nystagmus.  Extraocular tracking is well preserved, hearing is mildly impaired, bilateral hearing aids in place. Face is symmetric with normal facial animation. Speech is clear with no dysarthria noted. There is no hypophonia. There is no lip, neck/head, jaw or voice tremor. Neck is supple with full range of passive and active motion. There are no carotid bruits on auscultation. Oropharynx exam reveals: moderate mouth dryness, adequate dental hygiene.  Tongue protrudes centrally and palate elevates symmetrically.   Chest: Clear to auscultation without wheezing, rhonchi or crackles noted.  Heart: S1+S2+0, regular and normal  without murmurs, rubs or gallops noted.   Abdomen: Soft, non-tender and non-distended.  Extremities: There is no pitting edema in the distal lower extremities bilaterally.   Skin: Warm and dry without trophic changes noted.  Musculoskeletal: exam reveals no obvious joint deformities, tenderness or joint swelling or erythema.   Neurologically:  Mental status: The patient is awake, alert and oriented in all 4 spheres. Her immediate and remote memory, attention, language skills and fund of knowledge are appropriate. There is no evidence of aphasia, agnosia, apraxia or anomia. Speech is clear with normal prosody and enunciation. Thought process is linear. Mood is normal and affect is normal.  Cranial nerves II - XII are as described above under HEENT exam.  Motor exam: Normal bulk, strength and tone is noted. There is no drift, resting tremor or rebound.  She has a mild bilateral upper extremity postural and action tremor, no intention tremor.  Romberg is not tested due to safety concerns.  Reflexes are 1+ in the upper extremities and absent in the lower extremities.  Fine motor skills are grossly intact with finger taps and hand movements, foot taps are slightly slow bilaterally but without decrement in amplitude.  Cerebellar testing shows no dysmetria or intention tremor.   Sensory exam: intact to light touch in the upper and lower extremities. Gait, station and balance: She stands with mild difficulty and pushes herself up.  She requires no assistance.  Posture is Beck-appropriate.  She walks with a single-point cane on the right side.  She stands without difficulty.  She denies any orthostatic lightheadedness or vertiginous symptoms during gait examination.  She walks with a single-point cane.  She has no shuffling, no freezing, preserved arm swing on the left.  She walks slightly slowly and cautiously, nonspecific.               Assessment and Plan:    In summary, Brandi Beck is a very pleasant 87  y.o.-year old female with an underlying medical history of gout, diabetes, chronic neck pain with status post neck surgery, history of TIA, history of stroke, hypertension, hyperlipidemia, and mild obesity, who presents for evaluation of her dizziness of 5 to 6 weeks duration.  Her history and examination are not telltale for classic positional vertigo.  She certainly did not have any  reproducible symptoms when she went to physical therapy for rehab and on examination today.  She had some nonspecific fleeting dizziness symptoms, no obvious orthostatic symptoms, slight drop in orthostatic blood pressure value in the systolic number but unlikely the cause.  Examination is not supportive of any underlying parkinsonism.  She had a recent brain MRI and neck MRI, both nonrevealing.  We talked about dizziness and triggers and supportive treatments.  She is advised to continue to try to stay active, use her cane or walker for gait safety, change positions slowly, stay well-hydrated with water.  I would like to proceed with a carotid Doppler ultrasound to make sure there is no significant carotid artery stenosis or vertebral artery disease.  We will call her with her carotid Doppler test results, so long as the results are benign, she can follow-up in this clinic on an as-needed basis.  I answered all their questions today and the patient and her husband were in agreement.  Thank you very much for allowing me to participate in the care of this nice patient. If I can be of any further assistance to you please do not hesitate to call me at [phone].  Sincerely,   Brandi Age, MD, PhD

## 2020-04-12 NOTE — Patient Instructions (Signed)
Unfortunately, dizziness is a very common complaint but is often not due to a primary neurological reason or single underlying medical problem. Often, there a combination of factors, that result in dizziness. This includes blood pressure fluctuations, medication side effects, blood sugar fluctuations, stress, vertigo, poor sleep with sleep deprivation, dehydration, and electrolyte disturbance or other metabolic and endocrinological reasons, meaning hormone related problems such as thyroid dysfunction.  Your recent brain MRI did not show any obvious structural cause of your symptoms.   It is possible that you do have had dizziness because of medication side effects.  Especially your blood pressure medications and your gabapentin can cause dizziness.  We will investigate things further with a carotid Doppler ultrasound, which is an ultrasound of the main neck arteries. We will call you with the test results. Likely, they will call to schedule after insurance authorizes the test.   Please try to hydrate well with water, 6 to 8 cups of water per day are recommended, generally speaking, 8 ounce size each.  Try to change positions slowly and use your cane or walker for safety.  We can follow-up in this clinic on an as-needed basis, we will keep you posted as to your carotid Doppler test results by phone call for now.

## 2020-04-20 DIAGNOSIS — E1169 Type 2 diabetes mellitus with other specified complication: Secondary | ICD-10-CM | POA: Diagnosis not present

## 2020-04-20 DIAGNOSIS — R42 Dizziness and giddiness: Secondary | ICD-10-CM | POA: Diagnosis not present

## 2020-04-20 DIAGNOSIS — M5430 Sciatica, unspecified side: Secondary | ICD-10-CM | POA: Diagnosis not present

## 2020-04-20 DIAGNOSIS — I10 Essential (primary) hypertension: Secondary | ICD-10-CM | POA: Diagnosis not present

## 2020-04-27 ENCOUNTER — Other Ambulatory Visit: Payer: Self-pay

## 2020-04-27 ENCOUNTER — Ambulatory Visit (HOSPITAL_COMMUNITY)
Admission: RE | Admit: 2020-04-27 | Discharge: 2020-04-27 | Disposition: A | Discharge status: Home / Self Care | Payer: Medicare Other | Source: Ambulatory Visit | Attending: Neurology | Admitting: Neurology

## 2020-04-27 DIAGNOSIS — R42 Dizziness and giddiness: Secondary | ICD-10-CM | POA: Insufficient documentation

## 2020-04-27 NOTE — Progress Notes (Signed)
Carotid completed   Please see CV Proc for preliminary results.   Fortunata Betty, RVT  

## 2020-04-28 DIAGNOSIS — Z23 Encounter for immunization: Secondary | ICD-10-CM | POA: Diagnosis not present

## 2020-04-28 NOTE — Progress Notes (Signed)
Please call patient and advise her that her recent ultrasound of the carotid arteries showed benign findings in the left carotid artery and mild to moderate tightness in the right carotid artery, suggesting some degree of blockage, not a complete blockage.  We called this stenosis.  It is less than 80% and may not be considered significant enough to warrant any intervention but to be sure, we can get her evaluated further through a vascular surgeon.  If she is agreeable, please make a referral to vascular surgery for further evaluation and possible treatment options for carotid artery stenosis on the right side.

## 2020-05-03 ENCOUNTER — Telehealth: Payer: Self-pay

## 2020-05-03 DIAGNOSIS — I6521 Occlusion and stenosis of right carotid artery: Secondary | ICD-10-CM

## 2020-05-03 NOTE — Telephone Encounter (Signed)
I called the pt and we discussed results. Pt verbalized understanding and is ok with the Vascular surgery consult.  Referral placed in epic. Pt advised to call back if she has not heard about appt in a week.

## 2020-05-03 NOTE — Telephone Encounter (Signed)
-----   Message from Star Age, MD sent at 04/28/2020  5:53 PM EDT ----- Please call patient and advise her that her recent ultrasound of the carotid arteries showed benign findings in the left carotid artery and mild to moderate tightness in the right carotid artery, suggesting some degree of blockage, not a complete blockage.  We called this stenosis.  It is less than 80% and may not be considered significant enough to warrant any intervention but to be sure, we can get her evaluated further through a vascular surgeon.  If she is agreeable, please make a referral to vascular surgery for further evaluation and possible treatment options for carotid artery stenosis on the right side.

## 2020-05-17 DIAGNOSIS — M79674 Pain in right toe(s): Secondary | ICD-10-CM | POA: Diagnosis not present

## 2020-05-17 DIAGNOSIS — E1151 Type 2 diabetes mellitus with diabetic peripheral angiopathy without gangrene: Secondary | ICD-10-CM | POA: Diagnosis not present

## 2020-05-17 DIAGNOSIS — L602 Onychogryphosis: Secondary | ICD-10-CM | POA: Diagnosis not present

## 2020-05-17 DIAGNOSIS — B351 Tinea unguium: Secondary | ICD-10-CM | POA: Diagnosis not present

## 2020-05-23 NOTE — Progress Notes (Signed)
[facility] [place]  ASSESSMENT / PLAN: Brandi Beck is a 87 y.o. female with asymptomatic bilateral R (60-79%) > L (1-39%) carotid artery stenosis. The patient's stenosis is not yet at the threshold for prophylactic repair. I counseled the patient extensively about the natural history of carotid artery stenosis, and the difficulty in establishing a diagnosis in dizziness. The patient's carotid stenosis is not the cause of her dizziness. I encouraged the patient to try drinking more water and keeping a log of her dizziness episodes to see if this may help.   The patient should continue best medical therapy for carotid artery stenosis including: Complete abstinence from all tobacco products. Blood glucose control with goal A1c < 7%. Blood pressure control with goal blood pressure < 140/90 mmHg. Lipid reduction therapy with goal LDL-C <100 mg/dL (<70 if symptomatic from carotid artery stenosis).  Plavix 75mg  PO QD.  Atorvastatin 40-80mg  PO QD (or other "high intensity" statin therapy).  Follow up with PCP for further dizziness. Follow up with me in 1 year to repeat carotid duplex ultrasound for surveillance.  CHIEF COMPLAINT: dizziness  HISTORY OF PRESENT ILLNESS: Brandi Beck is a 87 y.o. female right-handed woman with several month history of intermittent dizziness.  She has noticed a brief sensation of feeling off balance or spinning sensation when she changes positions or turns her head. Symptoms come and go. She does not report any focal neurologic symptoms. She does not endorse any orthostatic symptoms. No vertebrobasilar symptoms.    Past Medical History:  Diagnosis Date  . Diabetes mellitus without complication ([place])   . Gout   . High cholesterol   . Hypertension    Surgical history: ACDF for neck pain   Family History  Problem Relation Age of Onset  . Kidney cancer Mother   . Breast cancer Neg Hx     Social History   Socioeconomic History  . Marital  status: Married    Spouse name: Not on file  . Number of children: Not on file  . Years of education: Not on file  . Highest education level: Not on file  Occupational History  . Not on file  Tobacco Use  . Smoking status: Never Smoker  . Smokeless tobacco: Never Used  Substance and Sexual Activity  . Alcohol use: No  . Drug use: Not on file  . Sexual activity: Never  Other Topics Concern  . Not on file  Social History Narrative  . Not on file   Social Determinants of Health   Financial Resource Strain: Not on file  Food Insecurity: Not on file  Transportation Needs: Not on file  Physical Activity: Not on file  Stress: Not on file  Social Connections: Not on file  Intimate Partner Violence: Not on file    Allergies  Allergen Reactions  . Citrus Swelling and Cough    Swollen lips  . Peanuts [Peanut Oil] Swelling and Cough    Swollen lips  . Aspirin Rash  . Niacin And Related Rash    Current Outpatient Medications  Medication Sig Dispense Refill  . acetaminophen (TYLENOL) 500 MG tablet Take 1,000 mg by mouth every 8 (eight) hours as needed for mild pain or moderate pain.    Marland Kitchen allopurinol (ZYLOPRIM) 100 MG tablet Take 200 mg by mouth daily.    Marland Kitchen atorvastatin (LIPITOR) 40 MG tablet Take 40 mg by mouth daily.    . carvedilol (COREG) 12.5 MG tablet Take 12.5 mg by mouth daily.    Marland Kitchen  clobetasol ointment (TEMOVATE) 0.73 % Apply 1 application topically daily as needed (sclerosis).    . clopidogrel (PLAVIX) 75 MG tablet Take 75 mg by mouth daily.    . fluocinonide cream (LIDEX) 7.10 % Apply 1 application topically daily as needed (rash).    . fluticasone (FLONASE) 50 MCG/ACT nasal spray Place 1 spray into both nostrils daily as needed for allergies or rhinitis.    Marland Kitchen HYDROcodone-acetaminophen (NORCO/VICODIN) 5-325 MG per tablet Take 1 tablet by mouth every 6 (six) hours as needed for moderate pain or severe pain. 15 tablet 0  . losartan (COZAAR) 25 MG tablet Take 25 mg by mouth  daily.    . metFORMIN (GLUCOPHAGE) 500 MG tablet Take 500 mg by mouth 2 (two) times daily with a meal.    . NIFEdipine (PROCARDIA-XL/ADALAT-CC/NIFEDICAL-XL) 30 MG 24 hr tablet Take 30 mg by mouth daily.    . Omega-3 Fatty Acids (FISH OIL PO) Take 1 capsule by mouth daily.    . sitaGLIPtin (JANUVIA) 50 MG tablet Take 50 mg by mouth daily.    Marland Kitchen spironolactone (ALDACTONE) 25 MG tablet Take 25 mg by mouth daily.     No current facility-administered medications for this visit.    REVIEW OF SYSTEMS:  [X]  denotes positive finding, [ ]  denotes negative finding Cardiac  Comments:  Chest pain or chest pressure:    Shortness of breath upon exertion:    Short of breath when lying flat:    Irregular heart rhythm:        Vascular    Pain in calf, thigh, or hip brought on by ambulation:    Pain in feet at night that wakes you up from your sleep:     Blood clot in your veins:    Leg swelling:         Pulmonary    Oxygen at home:    Productive cough:     Wheezing:         Neurologic    Sudden weakness in arms or legs:     Sudden numbness in arms or legs:     Sudden onset of difficulty speaking or slurred speech:    Temporary loss of vision in one eye:     Problems with dizziness:  x       Gastrointestinal    Blood in stool:     Vomited blood:         Genitourinary    Burning when urinating:     Blood in urine:        Psychiatric    Major depression:         Hematologic    Bleeding problems:    Problems with blood clotting too easily:        Skin    Rashes or ulcers:        Constitutional    Fever or chills:      PHYSICAL EXAM  Vitals:   05/24/20 0928 05/24/20 0931  BP: 125/72 117/70  Pulse: 87   Resp: 20   Temp: 98.2 F (36.8 C)   SpO2: 92%   Weight: 160 lb (72.6 kg)   Height: 5' (1.524 m)     Constitutional: well appearing. no distress. Appears well nourished.  Neurologic: CN intact. no focal findings. no sensory loss. Psychiatric: Mood and affect symmetric  and appropriate. Eyes: No icterus. No conjunctival pallor. Ears, nose, throat: mucous membranes moist. Midline trachea.  Cardiac: regular rate and rhythm.  Respiratory: unlabored. Abdominal: soft, non-tender,  non-distended.  Extremity: No edema. No cyanosis. No pallor.  Skin: No gangrene. No ulceration.  Lymphatic: No Stemmer's sign. No palpable lymphadenopathy.  PERTINENT LABORATORY AND RADIOLOGIC DATA  Most recent CBC CBC Latest Ref Rng & Units 08/25/2014 08/24/2014  WBC 4.0 - 10.5 K/uL 8.2 8.5  Hemoglobin 12.0 - 15.0 g/dL 13.6 14.3  Hematocrit 36.0 - 46.0 % 41.5 43.6  Platelets 150 - 400 K/uL 167 157     Most recent CMP CMP Latest Ref Rng & Units 08/25/2014 08/24/2014  Glucose 65 - 99 mg/dL 141(H) 251(H)  BUN 6 - 20 mg/dL 16 20  Creatinine 0.44 - 1.00 mg/dL 0.70 0.86  Sodium 135 - 145 mmol/L 136 137  Potassium 3.5 - 5.1 mmol/L 4.4 3.9  Chloride 101 - 111 mmol/L 108 107  CO2 22 - 32 mmol/L 23 19(L)  Calcium 8.9 - 10.3 mg/dL 9.2 9.3  Total Protein 6.5 - 8.1 g/dL 6.2(L) -  Total Bilirubin 0.3 - 1.2 mg/dL 0.8 -  Alkaline Phos 38 - 126 U/L 80 -  AST 15 - 41 U/L 15 -  ALT 14 - 54 U/L 16 -    Renal function CrCl cannot be calculated (Patient's most recent lab result is older than the maximum 21 days allowed.).  Hgb A1c MFr Bld (%)  Date Value  08/24/2014 7.1 (H)    No results found for: LDLCALC, LDLC, HIRISKLDL, POCLDL, LDLDIRECT, REALLDLC, TOTLDLC   Carotid Arterial Duplex Study   Indications:    Dizziness.  Risk Factors:   Hypertension, hyperlipidemia, Diabetes.  Comparison Study: No previous   Performing Technologist: Vonzell Schlatter RVT     Examination Guidelines: A complete evaluation includes B-mode imaging,  spectral  Doppler, color Doppler, and power Doppler as needed of all accessible  portions  of each vessel. Bilateral testing is considered an integral part of a  complete  examination. Limited examinations for reoccurring indications may be  performed   as noted.     Right Carotid Findings:  +----------+--------+--------+--------+----------------------+---------+       PSV cm/sEDV cm/sStenosisPlaque Description  Comments   +----------+--------+--------+--------+----------------------+---------+  CCA Prox 62   9                          +----------+--------+--------+--------+----------------------+---------+  CCA Distal72   14                         +----------+--------+--------+--------+----------------------+---------+  ICA Prox 222   47   60-79% calcific and irregularRatio 3.0  +----------+--------+--------+--------+----------------------+---------+  ICA Mid  184   26                         +----------+--------+--------+--------+----------------------+---------+  ICA Distal56   11                         +----------+--------+--------+--------+----------------------+---------+  ECA    111   8                          +----------+--------+--------+--------+----------------------+---------+   +----------+--------+-------+--------+-------------------+       PSV cm/sEDV cmsDescribeArm Pressure (mmHG)  +----------+--------+-------+--------+-------------------+  [id number]                       +----------+--------+-------+--------+-------------------+   +---------+--------+--+--------+--+  VertebralPSV cm/s44EDV cm/s10  +---------+--------+--+--------+--+      Left Carotid Findings:  +----------+--------+--------+--------+------------------+--------+  PSV cm/sEDV cm/sStenosisPlaque DescriptionComments  +----------+--------+--------+--------+------------------+--------+  CCA Prox 87   17                       +----------+--------+--------+--------+------------------+--------+  CCA Distal67   11                      +----------+--------+--------+--------+------------------+--------+  ICA Prox 93   24   1-39%  heterogenous         +----------+--------+--------+--------+------------------+--------+  ICA Mid  80   18                      +----------+--------+--------+--------+------------------+--------+  ICA Distal55   16                      +----------+--------+--------+--------+------------------+--------+  ECA    83   8                       +----------+--------+--------+--------+------------------+--------+   +----------+--------+--------+--------+-------------------+       PSV cm/sEDV cm/sDescribeArm Pressure (mmHG)  +----------+--------+--------+--------+-------------------+  Subclavian129                       +----------+--------+--------+--------+-------------------+   +---------+--------+--+--------+-+  VertebralPSV cm/s41EDV cm/s9  +---------+--------+--+--------+-+         Summary:  Right Carotid: Velocities in the right ICA are consistent with a 60-79%         stenosis.   Left Carotid: Velocities in the left ICA are consistent with a 1-39%  stenosis.   Vertebrals: Bilateral vertebral arteries demonstrate antegrade flow.  Subclavians: Normal flow hemodynamics were seen in bilateral subclavian        arteries.   *See table(s) above for measurements and observations.      Electronically signed by Harold Barban MD on 04/27/2020 at 9:59:03 PM.   Greater than 60 minutes was spent reviewing the patient's record, charting, interviewing, examining, and counseling the patient.   Yevonne Aline. Stanford Breed, MD [facility] Phone Number: [phone] 05/23/2020 11:45 AM

## 2020-05-24 ENCOUNTER — Other Ambulatory Visit: Payer: Self-pay

## 2020-05-24 ENCOUNTER — Ambulatory Visit (INDEPENDENT_AMBULATORY_CARE_PROVIDER_SITE_OTHER): Payer: Medicare Other | Admitting: Vascular Surgery

## 2020-05-24 ENCOUNTER — Encounter: Payer: Self-pay | Admitting: Vascular Surgery

## 2020-05-24 VITALS — BP 117/70 | HR 87 | Temp 98.2°F | Resp 20 | Ht 60.0 in | Wt 160.0 lb

## 2020-05-24 DIAGNOSIS — I6523 Occlusion and stenosis of bilateral carotid arteries: Secondary | ICD-10-CM

## 2020-06-27 DIAGNOSIS — M48061 Spinal stenosis, lumbar region without neurogenic claudication: Secondary | ICD-10-CM | POA: Diagnosis not present

## 2020-06-27 DIAGNOSIS — M545 Low back pain, unspecified: Secondary | ICD-10-CM | POA: Diagnosis not present

## 2020-07-23 DIAGNOSIS — Z20822 Contact with and (suspected) exposure to covid-19: Secondary | ICD-10-CM | POA: Diagnosis not present

## 2020-07-28 DIAGNOSIS — I10 Essential (primary) hypertension: Secondary | ICD-10-CM | POA: Diagnosis not present

## 2020-07-28 DIAGNOSIS — N904 Leukoplakia of vulva: Secondary | ICD-10-CM | POA: Diagnosis not present

## 2020-07-28 DIAGNOSIS — E1169 Type 2 diabetes mellitus with other specified complication: Secondary | ICD-10-CM | POA: Diagnosis not present

## 2020-07-28 DIAGNOSIS — R42 Dizziness and giddiness: Secondary | ICD-10-CM | POA: Diagnosis not present

## 2020-07-28 DIAGNOSIS — L309 Dermatitis, unspecified: Secondary | ICD-10-CM | POA: Diagnosis not present

## 2020-07-28 DIAGNOSIS — M5430 Sciatica, unspecified side: Secondary | ICD-10-CM | POA: Diagnosis not present

## 2020-08-09 DIAGNOSIS — E1151 Type 2 diabetes mellitus with diabetic peripheral angiopathy without gangrene: Secondary | ICD-10-CM | POA: Diagnosis not present

## 2020-08-09 DIAGNOSIS — L603 Nail dystrophy: Secondary | ICD-10-CM | POA: Diagnosis not present

## 2020-08-09 DIAGNOSIS — M79674 Pain in right toe(s): Secondary | ICD-10-CM | POA: Diagnosis not present

## 2020-08-09 DIAGNOSIS — B351 Tinea unguium: Secondary | ICD-10-CM | POA: Diagnosis not present

## 2020-08-15 DIAGNOSIS — H524 Presbyopia: Secondary | ICD-10-CM | POA: Diagnosis not present

## 2020-08-15 DIAGNOSIS — Z961 Presence of intraocular lens: Secondary | ICD-10-CM | POA: Diagnosis not present

## 2020-08-15 DIAGNOSIS — E119 Type 2 diabetes mellitus without complications: Secondary | ICD-10-CM | POA: Diagnosis not present

## 2020-08-15 DIAGNOSIS — H26492 Other secondary cataract, left eye: Secondary | ICD-10-CM | POA: Diagnosis not present

## 2020-10-04 DIAGNOSIS — Z23 Encounter for immunization: Secondary | ICD-10-CM | POA: Diagnosis not present

## 2020-11-01 DIAGNOSIS — E1151 Type 2 diabetes mellitus with diabetic peripheral angiopathy without gangrene: Secondary | ICD-10-CM | POA: Diagnosis not present

## 2020-11-01 DIAGNOSIS — B351 Tinea unguium: Secondary | ICD-10-CM | POA: Diagnosis not present

## 2020-11-29 DIAGNOSIS — M791 Myalgia, unspecified site: Secondary | ICD-10-CM | POA: Diagnosis not present

## 2020-11-29 DIAGNOSIS — M545 Low back pain, unspecified: Secondary | ICD-10-CM | POA: Diagnosis not present

## 2020-12-08 DIAGNOSIS — Z23 Encounter for immunization: Secondary | ICD-10-CM | POA: Diagnosis not present

## 2020-12-18 DIAGNOSIS — Z20828 Contact with and (suspected) exposure to other viral communicable diseases: Secondary | ICD-10-CM | POA: Diagnosis not present

## 2020-12-27 DIAGNOSIS — Z20822 Contact with and (suspected) exposure to covid-19: Secondary | ICD-10-CM | POA: Diagnosis not present

## 2021-01-31 DIAGNOSIS — E1151 Type 2 diabetes mellitus with diabetic peripheral angiopathy without gangrene: Secondary | ICD-10-CM | POA: Diagnosis not present

## 2021-01-31 DIAGNOSIS — B351 Tinea unguium: Secondary | ICD-10-CM | POA: Diagnosis not present

## 2021-02-07 DIAGNOSIS — E785 Hyperlipidemia, unspecified: Secondary | ICD-10-CM | POA: Diagnosis not present

## 2021-02-07 DIAGNOSIS — M109 Gout, unspecified: Secondary | ICD-10-CM | POA: Diagnosis not present

## 2021-02-07 DIAGNOSIS — I10 Essential (primary) hypertension: Secondary | ICD-10-CM | POA: Diagnosis not present

## 2021-02-07 DIAGNOSIS — E1169 Type 2 diabetes mellitus with other specified complication: Secondary | ICD-10-CM | POA: Diagnosis not present

## 2021-02-14 DIAGNOSIS — R197 Diarrhea, unspecified: Secondary | ICD-10-CM | POA: Diagnosis not present

## 2021-02-14 DIAGNOSIS — D692 Other nonthrombocytopenic purpura: Secondary | ICD-10-CM | POA: Diagnosis not present

## 2021-02-14 DIAGNOSIS — Z1339 Encounter for screening examination for other mental health and behavioral disorders: Secondary | ICD-10-CM | POA: Diagnosis not present

## 2021-02-14 DIAGNOSIS — I10 Essential (primary) hypertension: Secondary | ICD-10-CM | POA: Diagnosis not present

## 2021-02-14 DIAGNOSIS — E1169 Type 2 diabetes mellitus with other specified complication: Secondary | ICD-10-CM | POA: Diagnosis not present

## 2021-02-14 DIAGNOSIS — Z1331 Encounter for screening for depression: Secondary | ICD-10-CM | POA: Diagnosis not present

## 2021-02-14 DIAGNOSIS — R82998 Other abnormal findings in urine: Secondary | ICD-10-CM | POA: Diagnosis not present

## 2021-02-14 DIAGNOSIS — M109 Gout, unspecified: Secondary | ICD-10-CM | POA: Diagnosis not present

## 2021-02-14 DIAGNOSIS — Z8673 Personal history of transient ischemic attack (TIA), and cerebral infarction without residual deficits: Secondary | ICD-10-CM | POA: Diagnosis not present

## 2021-02-14 DIAGNOSIS — E785 Hyperlipidemia, unspecified: Secondary | ICD-10-CM | POA: Diagnosis not present

## 2021-02-14 DIAGNOSIS — M5430 Sciatica, unspecified side: Secondary | ICD-10-CM | POA: Diagnosis not present

## 2021-02-14 DIAGNOSIS — N904 Leukoplakia of vulva: Secondary | ICD-10-CM | POA: Diagnosis not present

## 2021-02-14 DIAGNOSIS — Z0000 Encounter for general adult medical examination without abnormal findings: Secondary | ICD-10-CM | POA: Diagnosis not present

## 2021-04-04 DIAGNOSIS — Z20822 Contact with and (suspected) exposure to covid-19: Secondary | ICD-10-CM | POA: Diagnosis not present

## 2021-04-18 DIAGNOSIS — L84 Corns and callosities: Secondary | ICD-10-CM | POA: Diagnosis not present

## 2021-04-18 DIAGNOSIS — E1151 Type 2 diabetes mellitus with diabetic peripheral angiopathy without gangrene: Secondary | ICD-10-CM | POA: Diagnosis not present

## 2021-04-18 DIAGNOSIS — B351 Tinea unguium: Secondary | ICD-10-CM | POA: Diagnosis not present

## 2021-05-06 DIAGNOSIS — Z20822 Contact with and (suspected) exposure to covid-19: Secondary | ICD-10-CM | POA: Diagnosis not present

## 2021-05-12 DIAGNOSIS — Z20822 Contact with and (suspected) exposure to covid-19: Secondary | ICD-10-CM | POA: Diagnosis not present

## 2021-05-23 DIAGNOSIS — Z20822 Contact with and (suspected) exposure to covid-19: Secondary | ICD-10-CM | POA: Diagnosis not present

## 2021-06-19 DIAGNOSIS — Z23 Encounter for immunization: Secondary | ICD-10-CM | POA: Diagnosis not present

## 2021-06-27 DIAGNOSIS — E1151 Type 2 diabetes mellitus with diabetic peripheral angiopathy without gangrene: Secondary | ICD-10-CM | POA: Diagnosis not present

## 2021-06-27 DIAGNOSIS — B351 Tinea unguium: Secondary | ICD-10-CM | POA: Diagnosis not present

## 2021-07-24 DIAGNOSIS — M48061 Spinal stenosis, lumbar region without neurogenic claudication: Secondary | ICD-10-CM | POA: Diagnosis not present

## 2021-08-16 DIAGNOSIS — H26492 Other secondary cataract, left eye: Secondary | ICD-10-CM | POA: Diagnosis not present

## 2021-08-16 DIAGNOSIS — Z961 Presence of intraocular lens: Secondary | ICD-10-CM | POA: Diagnosis not present

## 2021-08-16 DIAGNOSIS — E119 Type 2 diabetes mellitus without complications: Secondary | ICD-10-CM | POA: Diagnosis not present

## 2021-08-16 DIAGNOSIS — H524 Presbyopia: Secondary | ICD-10-CM | POA: Diagnosis not present

## 2021-08-28 DIAGNOSIS — M5430 Sciatica, unspecified side: Secondary | ICD-10-CM | POA: Diagnosis not present

## 2021-08-28 DIAGNOSIS — E1169 Type 2 diabetes mellitus with other specified complication: Secondary | ICD-10-CM | POA: Diagnosis not present

## 2021-08-28 DIAGNOSIS — R32 Unspecified urinary incontinence: Secondary | ICD-10-CM | POA: Diagnosis not present

## 2021-08-28 DIAGNOSIS — M109 Gout, unspecified: Secondary | ICD-10-CM | POA: Diagnosis not present

## 2021-08-28 DIAGNOSIS — I10 Essential (primary) hypertension: Secondary | ICD-10-CM | POA: Diagnosis not present

## 2021-08-28 DIAGNOSIS — E785 Hyperlipidemia, unspecified: Secondary | ICD-10-CM | POA: Diagnosis not present

## 2021-08-28 DIAGNOSIS — Z8673 Personal history of transient ischemic attack (TIA), and cerebral infarction without residual deficits: Secondary | ICD-10-CM | POA: Diagnosis not present

## 2021-08-28 DIAGNOSIS — R197 Diarrhea, unspecified: Secondary | ICD-10-CM | POA: Diagnosis not present

## 2021-08-28 DIAGNOSIS — D692 Other nonthrombocytopenic purpura: Secondary | ICD-10-CM | POA: Diagnosis not present

## 2021-08-28 DIAGNOSIS — N904 Leukoplakia of vulva: Secondary | ICD-10-CM | POA: Diagnosis not present

## 2021-09-26 DIAGNOSIS — M79674 Pain in right toe(s): Secondary | ICD-10-CM | POA: Diagnosis not present

## 2021-09-26 DIAGNOSIS — M79675 Pain in left toe(s): Secondary | ICD-10-CM | POA: Diagnosis not present

## 2021-09-26 DIAGNOSIS — E1151 Type 2 diabetes mellitus with diabetic peripheral angiopathy without gangrene: Secondary | ICD-10-CM | POA: Diagnosis not present

## 2021-09-26 DIAGNOSIS — B351 Tinea unguium: Secondary | ICD-10-CM | POA: Diagnosis not present

## 2021-10-10 DIAGNOSIS — Z23 Encounter for immunization: Secondary | ICD-10-CM | POA: Diagnosis not present

## 2021-10-30 DIAGNOSIS — Z23 Encounter for immunization: Secondary | ICD-10-CM | POA: Diagnosis not present

## 2021-11-16 DIAGNOSIS — E1169 Type 2 diabetes mellitus with other specified complication: Secondary | ICD-10-CM | POA: Diagnosis not present

## 2021-11-16 DIAGNOSIS — R197 Diarrhea, unspecified: Secondary | ICD-10-CM | POA: Diagnosis not present

## 2021-12-05 DIAGNOSIS — B351 Tinea unguium: Secondary | ICD-10-CM | POA: Diagnosis not present

## 2021-12-05 DIAGNOSIS — L84 Corns and callosities: Secondary | ICD-10-CM | POA: Diagnosis not present

## 2021-12-05 DIAGNOSIS — M79675 Pain in left toe(s): Secondary | ICD-10-CM | POA: Diagnosis not present

## 2021-12-05 DIAGNOSIS — E1151 Type 2 diabetes mellitus with diabetic peripheral angiopathy without gangrene: Secondary | ICD-10-CM | POA: Diagnosis not present

## 2021-12-05 DIAGNOSIS — M79674 Pain in right toe(s): Secondary | ICD-10-CM | POA: Diagnosis not present

## 2021-12-20 DIAGNOSIS — E1169 Type 2 diabetes mellitus with other specified complication: Secondary | ICD-10-CM | POA: Diagnosis not present

## 2021-12-20 DIAGNOSIS — I10 Essential (primary) hypertension: Secondary | ICD-10-CM | POA: Diagnosis not present

## 2021-12-20 DIAGNOSIS — R197 Diarrhea, unspecified: Secondary | ICD-10-CM | POA: Diagnosis not present

## 2022-02-06 DIAGNOSIS — M2011 Hallux valgus (acquired), right foot: Secondary | ICD-10-CM | POA: Diagnosis not present

## 2022-02-06 DIAGNOSIS — M2042 Other hammer toe(s) (acquired), left foot: Secondary | ICD-10-CM | POA: Diagnosis not present

## 2022-02-06 DIAGNOSIS — L84 Corns and callosities: Secondary | ICD-10-CM | POA: Diagnosis not present

## 2022-02-06 DIAGNOSIS — M79674 Pain in right toe(s): Secondary | ICD-10-CM | POA: Diagnosis not present

## 2022-02-06 DIAGNOSIS — M2041 Other hammer toe(s) (acquired), right foot: Secondary | ICD-10-CM | POA: Diagnosis not present

## 2022-02-06 DIAGNOSIS — M79675 Pain in left toe(s): Secondary | ICD-10-CM | POA: Diagnosis not present

## 2022-02-06 DIAGNOSIS — E1151 Type 2 diabetes mellitus with diabetic peripheral angiopathy without gangrene: Secondary | ICD-10-CM | POA: Diagnosis not present

## 2022-02-06 DIAGNOSIS — I739 Peripheral vascular disease, unspecified: Secondary | ICD-10-CM | POA: Diagnosis not present

## 2022-02-06 DIAGNOSIS — M2012 Hallux valgus (acquired), left foot: Secondary | ICD-10-CM | POA: Diagnosis not present

## 2022-02-06 DIAGNOSIS — B351 Tinea unguium: Secondary | ICD-10-CM | POA: Diagnosis not present

## 2022-02-08 DIAGNOSIS — M48061 Spinal stenosis, lumbar region without neurogenic claudication: Secondary | ICD-10-CM | POA: Diagnosis not present

## 2022-02-14 DIAGNOSIS — E785 Hyperlipidemia, unspecified: Secondary | ICD-10-CM | POA: Diagnosis not present

## 2022-02-14 DIAGNOSIS — M109 Gout, unspecified: Secondary | ICD-10-CM | POA: Diagnosis not present

## 2022-02-14 DIAGNOSIS — I10 Essential (primary) hypertension: Secondary | ICD-10-CM | POA: Diagnosis not present

## 2022-02-14 DIAGNOSIS — E1169 Type 2 diabetes mellitus with other specified complication: Secondary | ICD-10-CM | POA: Diagnosis not present

## 2022-02-21 DIAGNOSIS — E1169 Type 2 diabetes mellitus with other specified complication: Secondary | ICD-10-CM | POA: Diagnosis not present

## 2022-02-21 DIAGNOSIS — M5430 Sciatica, unspecified side: Secondary | ICD-10-CM | POA: Diagnosis not present

## 2022-02-21 DIAGNOSIS — M21619 Bunion of unspecified foot: Secondary | ICD-10-CM | POA: Diagnosis not present

## 2022-02-21 DIAGNOSIS — Z0000 Encounter for general adult medical examination without abnormal findings: Secondary | ICD-10-CM | POA: Diagnosis not present

## 2022-02-21 DIAGNOSIS — D692 Other nonthrombocytopenic purpura: Secondary | ICD-10-CM | POA: Diagnosis not present

## 2022-02-21 DIAGNOSIS — M109 Gout, unspecified: Secondary | ICD-10-CM | POA: Diagnosis not present

## 2022-02-21 DIAGNOSIS — Z1339 Encounter for screening examination for other mental health and behavioral disorders: Secondary | ICD-10-CM | POA: Diagnosis not present

## 2022-02-21 DIAGNOSIS — N904 Leukoplakia of vulva: Secondary | ICD-10-CM | POA: Diagnosis not present

## 2022-02-21 DIAGNOSIS — E785 Hyperlipidemia, unspecified: Secondary | ICD-10-CM | POA: Diagnosis not present

## 2022-02-21 DIAGNOSIS — Z1331 Encounter for screening for depression: Secondary | ICD-10-CM | POA: Diagnosis not present

## 2022-02-21 DIAGNOSIS — Z8673 Personal history of transient ischemic attack (TIA), and cerebral infarction without residual deficits: Secondary | ICD-10-CM | POA: Diagnosis not present

## 2022-02-21 DIAGNOSIS — I10 Essential (primary) hypertension: Secondary | ICD-10-CM | POA: Diagnosis not present

## 2022-04-10 DIAGNOSIS — B351 Tinea unguium: Secondary | ICD-10-CM | POA: Diagnosis not present

## 2022-04-10 DIAGNOSIS — L84 Corns and callosities: Secondary | ICD-10-CM | POA: Diagnosis not present

## 2022-04-10 DIAGNOSIS — M2042 Other hammer toe(s) (acquired), left foot: Secondary | ICD-10-CM | POA: Diagnosis not present

## 2022-04-10 DIAGNOSIS — M2011 Hallux valgus (acquired), right foot: Secondary | ICD-10-CM | POA: Diagnosis not present

## 2022-04-10 DIAGNOSIS — M79675 Pain in left toe(s): Secondary | ICD-10-CM | POA: Diagnosis not present

## 2022-04-10 DIAGNOSIS — M2041 Other hammer toe(s) (acquired), right foot: Secondary | ICD-10-CM | POA: Diagnosis not present

## 2022-04-10 DIAGNOSIS — E1151 Type 2 diabetes mellitus with diabetic peripheral angiopathy without gangrene: Secondary | ICD-10-CM | POA: Diagnosis not present

## 2022-04-10 DIAGNOSIS — M2012 Hallux valgus (acquired), left foot: Secondary | ICD-10-CM | POA: Diagnosis not present

## 2022-04-10 DIAGNOSIS — M79674 Pain in right toe(s): Secondary | ICD-10-CM | POA: Diagnosis not present

## 2022-04-10 DIAGNOSIS — I739 Peripheral vascular disease, unspecified: Secondary | ICD-10-CM | POA: Diagnosis not present

## 2022-06-12 DIAGNOSIS — E1151 Type 2 diabetes mellitus with diabetic peripheral angiopathy without gangrene: Secondary | ICD-10-CM | POA: Diagnosis not present

## 2022-06-12 DIAGNOSIS — M79675 Pain in left toe(s): Secondary | ICD-10-CM | POA: Diagnosis not present

## 2022-06-12 DIAGNOSIS — I739 Peripheral vascular disease, unspecified: Secondary | ICD-10-CM | POA: Diagnosis not present

## 2022-06-12 DIAGNOSIS — B351 Tinea unguium: Secondary | ICD-10-CM | POA: Diagnosis not present

## 2022-06-12 DIAGNOSIS — M2011 Hallux valgus (acquired), right foot: Secondary | ICD-10-CM | POA: Diagnosis not present

## 2022-06-12 DIAGNOSIS — M2012 Hallux valgus (acquired), left foot: Secondary | ICD-10-CM | POA: Diagnosis not present

## 2022-06-12 DIAGNOSIS — M2041 Other hammer toe(s) (acquired), right foot: Secondary | ICD-10-CM | POA: Diagnosis not present

## 2022-06-12 DIAGNOSIS — M2042 Other hammer toe(s) (acquired), left foot: Secondary | ICD-10-CM | POA: Diagnosis not present

## 2022-06-12 DIAGNOSIS — L84 Corns and callosities: Secondary | ICD-10-CM | POA: Diagnosis not present

## 2022-06-12 DIAGNOSIS — M79674 Pain in right toe(s): Secondary | ICD-10-CM | POA: Diagnosis not present

## 2022-06-22 IMAGING — MR MR HEAD WO/W CM
12 series · 48 of 48 positions shown · IV contrast (multihance)
Comparison: Head CT 08/24/2014.

CLINICAL DATA: Dizziness. History of TIA (transient ischemic
attack) and stroke. Additional history provided by scanning
technologist: Patient reports dizziness for 1 month, tremors,
difficulty walking, numbness, hearing loss, history of hypertension.

EXAM:
MRI HEAD WITHOUT AND WITH CONTRAST
TECHNIQUE: Multiplanar, multiecho pulse sequences of the brain and surrounding
structures were obtained without and with intravenous contrast.
CONTRAST:  17mL MULTIHANCE GADOBENATE DIMEGLUMINE 529 MG/ML IV SOLN

[Series 4: T1 · sagittal · 5.0mm · 0.45mm/px · 2 of 23 slices shown]
[im 1/23]
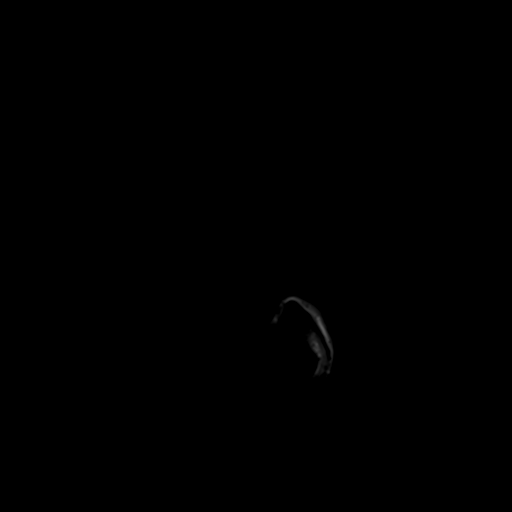
[im 23/23]
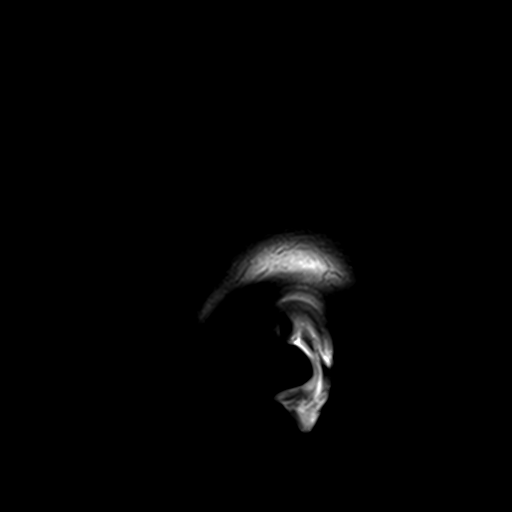

[Series 5: DWI · axial · 3.0mm · 1.80mm/px · z∈[-58,+88]mm · 7 of 98 slices shown (1 of 4)]
[im 1/98]
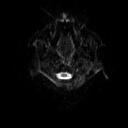
[im 17/98]
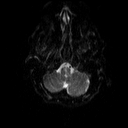
[im 33/98]
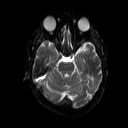
[im 49/98]
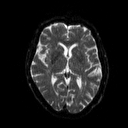
[im 65/98]
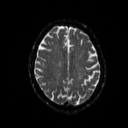
[im 81/98]
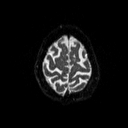
[im 98/98]
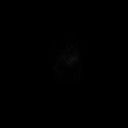

[Series 6: DWI · axial · 3.0mm · 1.80mm/px · z∈[-58,+88]mm · 3 of 49 slices shown (2 of 4)]
[im 1/49]
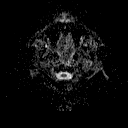
[im 25/49]
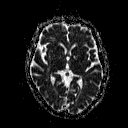
[im 49/49]
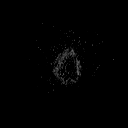

[Series 7: DWI · coronal · 5.0mm · 1.80mm/px · 5 of 74 slices shown (3 of 4)]
[im 1/74]
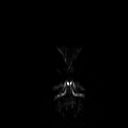
[im 19/74]
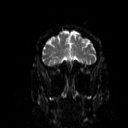
[im 37/74]
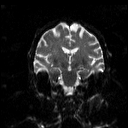
[im 55/74]
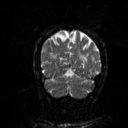
[im 74/74]
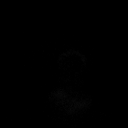

[Series 8: DWI · coronal · 5.0mm · 1.80mm/px · 2 of 37 slices shown (4 of 4)]
[im 1/37]
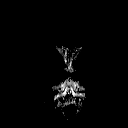
[im 37/37]
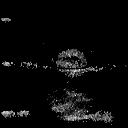

[Series 9: T2 · axial · 5.0mm · 0.51mm/px · 1 of 22 slices shown (1 of 2)]
[im 1/22]
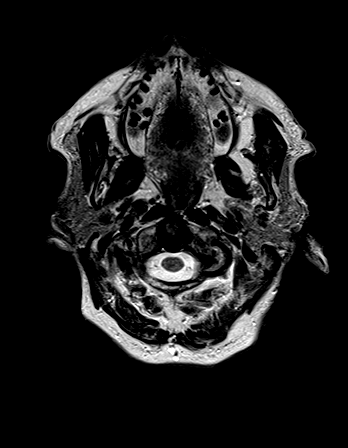

[Series 10: FLAIR · axial · 3.0mm · 0.45mm/px · z∈[-60,+84]mm · 2 of 32 slices shown]
[im 1/32]
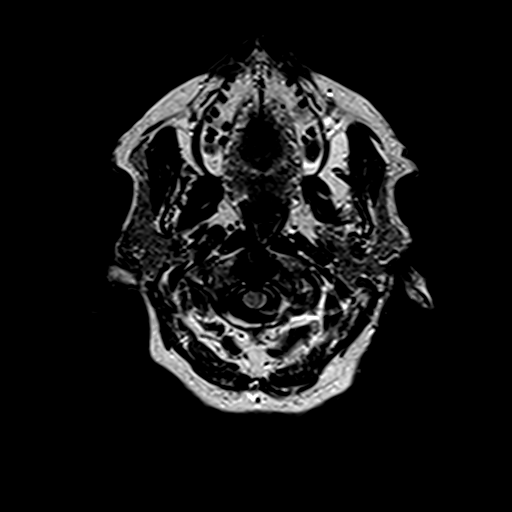
[im 32/32]
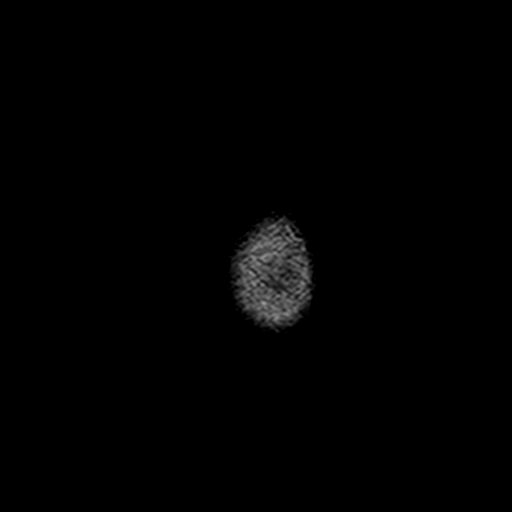

[Series 12: swi_images · axial · 4.0mm · 0.90mm/px · z∈[-58,+82]mm · 2 of 36 slices shown]
[im 1/36]
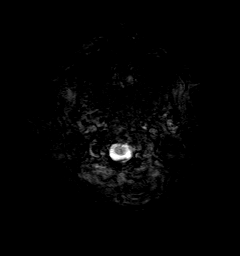
[im 36/36]
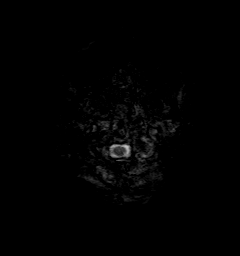

[Series 13: t1_mpr_tra · axial · 1.0mm · 0.71mm/px · z∈[-58,+84]mm · 10 of 144 slices shown]
[im 1/144]
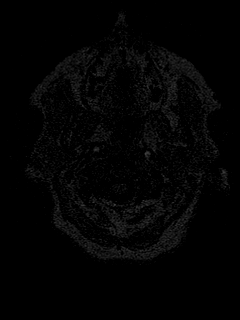
[im 16/144]
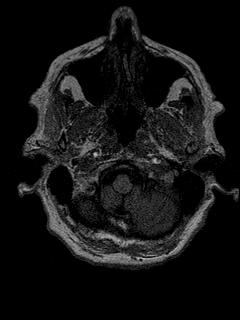
[im 32/144]
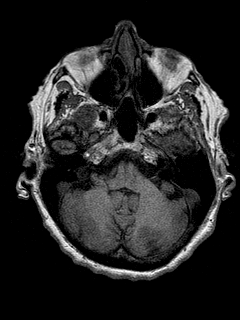
[im 48/144]
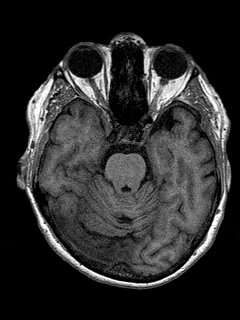
[im 64/144]
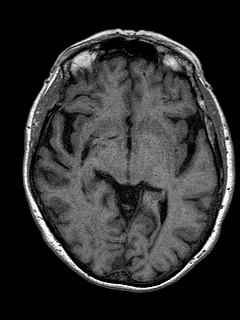
[im 80/144]
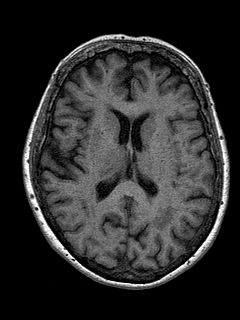
[im 96/144]
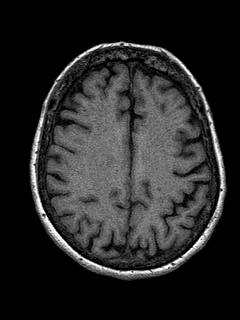
[im 112/144]
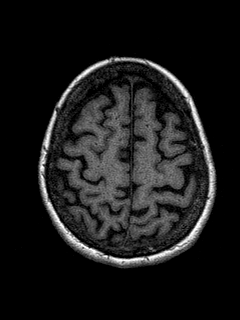
[im 128/144]
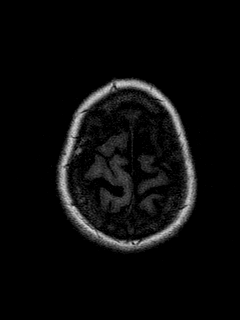
[im 144/144]
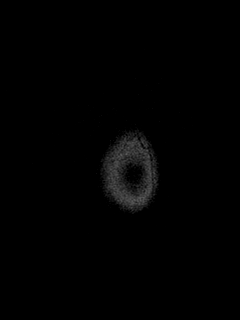

[Series 14: T2 · coronal · 5.0mm · 0.45mm/px · 2 of 30 slices shown (2 of 2)]
[im 1/30]
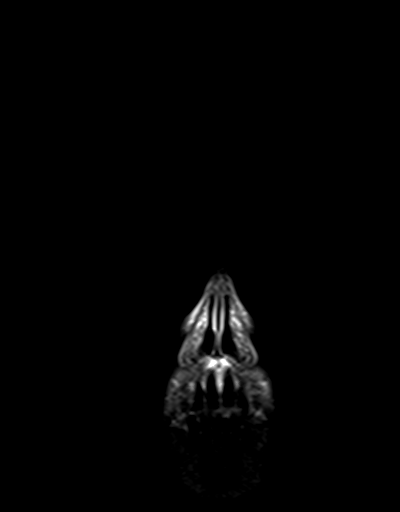
[im 30/30]
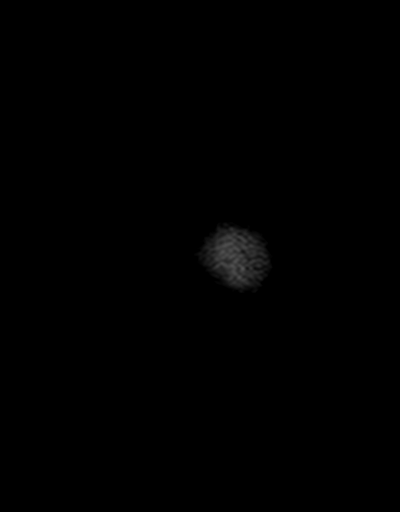

[Series 15: t1_mpr_tra postbrain · axial · 1.0mm · 0.75mm/px · z∈[-56,+87]mm · 10 of 144 slices shown]
[im 1/144]
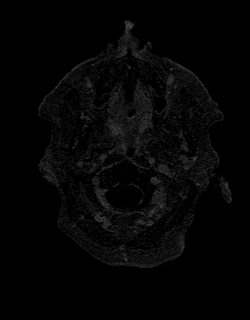
[im 16/144]
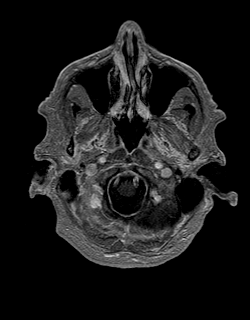
[im 32/144]
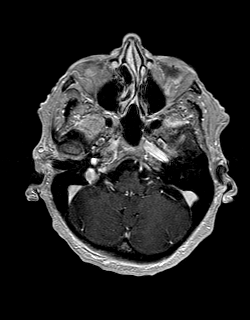
[im 48/144]
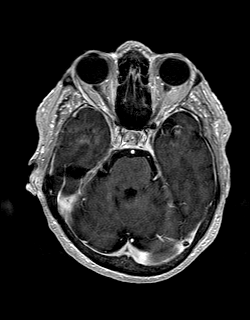
[im 64/144]
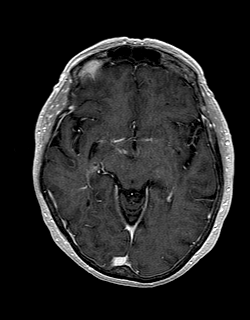
[im 80/144]
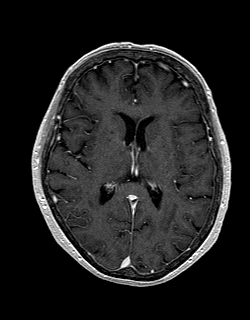
[im 96/144]
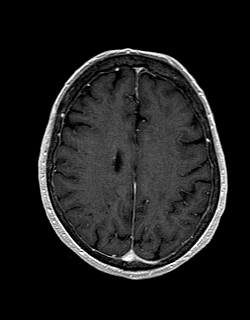
[im 112/144]
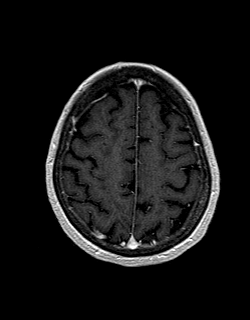
[im 128/144]
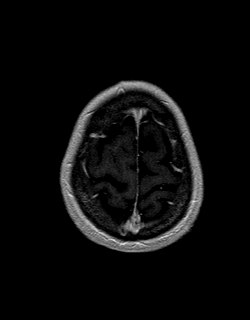
[im 144/144]
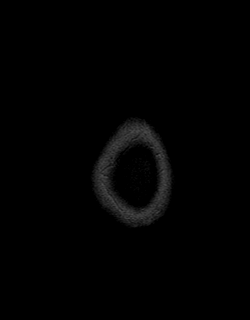

[Series 16: post cor · coronal · 5.0mm · 0.45mm/px · 2 of 25 slices shown]
[im 1/25]
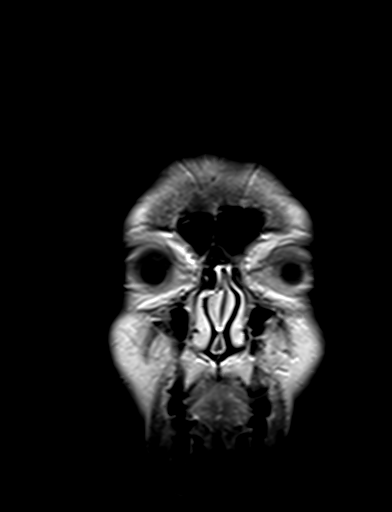
[im 25/25]
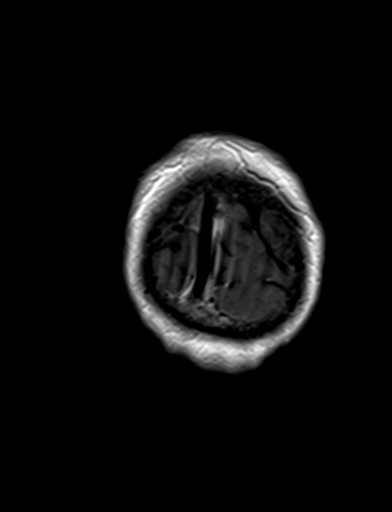

[48 of 48 positions shown; findings below may reference images not displayed]

FINDINGS: Brain:

Mild intermittent motion degradation.

Mild cerebral atrophy.

Chronic small-vessel infarcts within bilateral basal ganglia and
right thalamus.

Background mild to moderate multifocal T2/FLAIR hyperintensity
within the cerebral white matter is nonspecific, but compatible with
chronic small vessel ischemic disease.

There is no acute infarct.

No evidence of intracranial mass.

No chronic intracranial blood products.

No extra-axial fluid collection.

No midline shift.

No abnormal intracranial enhancement.

Vascular: Expected proximal arterial flow voids.

Skull and upper cervical spine: No focal marrow lesion. Incompletely
assessed upper cervical spondylosis.

Sinuses/Orbits: Visualized orbits show no acute finding. Trace
ethmoid sinus mucosal thickening bilaterally.
IMPRESSION: Mildly motion degraded exam.

No evidence of acute intracranial abnormality, including acute
infarction.

Chronic small-vessel infarcts within the bilateral basal ganglia and
right thalamus.

Background mild-to-moderate cerebral white matter chronic small
vessel ischemic disease.

Mild cerebral atrophy.

## 2022-08-03 DIAGNOSIS — N904 Leukoplakia of vulva: Secondary | ICD-10-CM | POA: Diagnosis not present

## 2022-08-03 DIAGNOSIS — I10 Essential (primary) hypertension: Secondary | ICD-10-CM | POA: Diagnosis not present

## 2022-08-03 DIAGNOSIS — R197 Diarrhea, unspecified: Secondary | ICD-10-CM | POA: Diagnosis not present

## 2022-08-03 DIAGNOSIS — D692 Other nonthrombocytopenic purpura: Secondary | ICD-10-CM | POA: Diagnosis not present

## 2022-08-03 DIAGNOSIS — M5430 Sciatica, unspecified side: Secondary | ICD-10-CM | POA: Diagnosis not present

## 2022-08-03 DIAGNOSIS — Z8673 Personal history of transient ischemic attack (TIA), and cerebral infarction without residual deficits: Secondary | ICD-10-CM | POA: Diagnosis not present

## 2022-08-03 DIAGNOSIS — E785 Hyperlipidemia, unspecified: Secondary | ICD-10-CM | POA: Diagnosis not present

## 2022-08-03 DIAGNOSIS — E1169 Type 2 diabetes mellitus with other specified complication: Secondary | ICD-10-CM | POA: Diagnosis not present

## 2022-08-03 DIAGNOSIS — M109 Gout, unspecified: Secondary | ICD-10-CM | POA: Diagnosis not present

## 2022-08-06 DIAGNOSIS — M48061 Spinal stenosis, lumbar region without neurogenic claudication: Secondary | ICD-10-CM | POA: Diagnosis not present

## 2022-08-14 DIAGNOSIS — M79675 Pain in left toe(s): Secondary | ICD-10-CM | POA: Diagnosis not present

## 2022-08-14 DIAGNOSIS — M2042 Other hammer toe(s) (acquired), left foot: Secondary | ICD-10-CM | POA: Diagnosis not present

## 2022-08-14 DIAGNOSIS — I739 Peripheral vascular disease, unspecified: Secondary | ICD-10-CM | POA: Diagnosis not present

## 2022-08-14 DIAGNOSIS — M2011 Hallux valgus (acquired), right foot: Secondary | ICD-10-CM | POA: Diagnosis not present

## 2022-08-14 DIAGNOSIS — B351 Tinea unguium: Secondary | ICD-10-CM | POA: Diagnosis not present

## 2022-08-14 DIAGNOSIS — M2041 Other hammer toe(s) (acquired), right foot: Secondary | ICD-10-CM | POA: Diagnosis not present

## 2022-08-14 DIAGNOSIS — E1151 Type 2 diabetes mellitus with diabetic peripheral angiopathy without gangrene: Secondary | ICD-10-CM | POA: Diagnosis not present

## 2022-08-14 DIAGNOSIS — L84 Corns and callosities: Secondary | ICD-10-CM | POA: Diagnosis not present

## 2022-08-14 DIAGNOSIS — M79674 Pain in right toe(s): Secondary | ICD-10-CM | POA: Diagnosis not present

## 2022-08-14 DIAGNOSIS — M2012 Hallux valgus (acquired), left foot: Secondary | ICD-10-CM | POA: Diagnosis not present

## 2022-08-20 DIAGNOSIS — E119 Type 2 diabetes mellitus without complications: Secondary | ICD-10-CM | POA: Diagnosis not present

## 2022-08-20 DIAGNOSIS — H26492 Other secondary cataract, left eye: Secondary | ICD-10-CM | POA: Diagnosis not present

## 2022-08-20 DIAGNOSIS — Z961 Presence of intraocular lens: Secondary | ICD-10-CM | POA: Diagnosis not present

## 2022-08-20 DIAGNOSIS — H52203 Unspecified astigmatism, bilateral: Secondary | ICD-10-CM | POA: Diagnosis not present

## 2022-09-21 DIAGNOSIS — Z23 Encounter for immunization: Secondary | ICD-10-CM | POA: Diagnosis not present

## 2022-11-13 DIAGNOSIS — M2042 Other hammer toe(s) (acquired), left foot: Secondary | ICD-10-CM | POA: Diagnosis not present

## 2022-11-13 DIAGNOSIS — M2011 Hallux valgus (acquired), right foot: Secondary | ICD-10-CM | POA: Diagnosis not present

## 2022-11-13 DIAGNOSIS — B351 Tinea unguium: Secondary | ICD-10-CM | POA: Diagnosis not present

## 2022-11-13 DIAGNOSIS — L84 Corns and callosities: Secondary | ICD-10-CM | POA: Diagnosis not present

## 2022-11-13 DIAGNOSIS — I739 Peripheral vascular disease, unspecified: Secondary | ICD-10-CM | POA: Diagnosis not present

## 2022-11-13 DIAGNOSIS — E1151 Type 2 diabetes mellitus with diabetic peripheral angiopathy without gangrene: Secondary | ICD-10-CM | POA: Diagnosis not present

## 2022-11-13 DIAGNOSIS — M2041 Other hammer toe(s) (acquired), right foot: Secondary | ICD-10-CM | POA: Diagnosis not present

## 2022-11-13 DIAGNOSIS — M2012 Hallux valgus (acquired), left foot: Secondary | ICD-10-CM | POA: Diagnosis not present

## 2022-11-13 DIAGNOSIS — M79675 Pain in left toe(s): Secondary | ICD-10-CM | POA: Diagnosis not present

## 2022-11-13 DIAGNOSIS — M79674 Pain in right toe(s): Secondary | ICD-10-CM | POA: Diagnosis not present

## 2023-01-29 DIAGNOSIS — M2011 Hallux valgus (acquired), right foot: Secondary | ICD-10-CM | POA: Diagnosis not present

## 2023-01-29 DIAGNOSIS — M2042 Other hammer toe(s) (acquired), left foot: Secondary | ICD-10-CM | POA: Diagnosis not present

## 2023-01-29 DIAGNOSIS — L84 Corns and callosities: Secondary | ICD-10-CM | POA: Diagnosis not present

## 2023-01-29 DIAGNOSIS — M79674 Pain in right toe(s): Secondary | ICD-10-CM | POA: Diagnosis not present

## 2023-01-29 DIAGNOSIS — I739 Peripheral vascular disease, unspecified: Secondary | ICD-10-CM | POA: Diagnosis not present

## 2023-01-29 DIAGNOSIS — M79675 Pain in left toe(s): Secondary | ICD-10-CM | POA: Diagnosis not present

## 2023-01-29 DIAGNOSIS — L851 Acquired keratosis [keratoderma] palmaris et plantaris: Secondary | ICD-10-CM | POA: Diagnosis not present

## 2023-01-29 DIAGNOSIS — M2012 Hallux valgus (acquired), left foot: Secondary | ICD-10-CM | POA: Diagnosis not present

## 2023-01-29 DIAGNOSIS — B351 Tinea unguium: Secondary | ICD-10-CM | POA: Diagnosis not present

## 2023-01-29 DIAGNOSIS — M2041 Other hammer toe(s) (acquired), right foot: Secondary | ICD-10-CM | POA: Diagnosis not present

## 2023-01-29 DIAGNOSIS — E1151 Type 2 diabetes mellitus with diabetic peripheral angiopathy without gangrene: Secondary | ICD-10-CM | POA: Diagnosis not present

## 2023-02-19 DIAGNOSIS — M79674 Pain in right toe(s): Secondary | ICD-10-CM | POA: Diagnosis not present

## 2023-02-19 DIAGNOSIS — M109 Gout, unspecified: Secondary | ICD-10-CM | POA: Diagnosis not present

## 2023-02-19 DIAGNOSIS — E1169 Type 2 diabetes mellitus with other specified complication: Secondary | ICD-10-CM | POA: Diagnosis not present

## 2023-02-19 DIAGNOSIS — I739 Peripheral vascular disease, unspecified: Secondary | ICD-10-CM | POA: Diagnosis not present

## 2023-02-19 DIAGNOSIS — M2012 Hallux valgus (acquired), left foot: Secondary | ICD-10-CM | POA: Diagnosis not present

## 2023-02-19 DIAGNOSIS — M79675 Pain in left toe(s): Secondary | ICD-10-CM | POA: Diagnosis not present

## 2023-02-19 DIAGNOSIS — I10 Essential (primary) hypertension: Secondary | ICD-10-CM | POA: Diagnosis not present

## 2023-02-19 DIAGNOSIS — E785 Hyperlipidemia, unspecified: Secondary | ICD-10-CM | POA: Diagnosis not present

## 2023-02-19 DIAGNOSIS — M2011 Hallux valgus (acquired), right foot: Secondary | ICD-10-CM | POA: Diagnosis not present

## 2023-02-19 DIAGNOSIS — M2042 Other hammer toe(s) (acquired), left foot: Secondary | ICD-10-CM | POA: Diagnosis not present

## 2023-02-19 DIAGNOSIS — L851 Acquired keratosis [keratoderma] palmaris et plantaris: Secondary | ICD-10-CM | POA: Diagnosis not present

## 2023-02-19 DIAGNOSIS — E1151 Type 2 diabetes mellitus with diabetic peripheral angiopathy without gangrene: Secondary | ICD-10-CM | POA: Diagnosis not present

## 2023-02-19 DIAGNOSIS — M2041 Other hammer toe(s) (acquired), right foot: Secondary | ICD-10-CM | POA: Diagnosis not present

## 2023-02-19 DIAGNOSIS — B351 Tinea unguium: Secondary | ICD-10-CM | POA: Diagnosis not present

## 2023-02-19 DIAGNOSIS — L84 Corns and callosities: Secondary | ICD-10-CM | POA: Diagnosis not present

## 2023-02-25 DIAGNOSIS — Z1339 Encounter for screening examination for other mental health and behavioral disorders: Secondary | ICD-10-CM | POA: Diagnosis not present

## 2023-02-25 DIAGNOSIS — E785 Hyperlipidemia, unspecified: Secondary | ICD-10-CM | POA: Diagnosis not present

## 2023-02-25 DIAGNOSIS — M109 Gout, unspecified: Secondary | ICD-10-CM | POA: Diagnosis not present

## 2023-02-25 DIAGNOSIS — R197 Diarrhea, unspecified: Secondary | ICD-10-CM | POA: Diagnosis not present

## 2023-02-25 DIAGNOSIS — Z23 Encounter for immunization: Secondary | ICD-10-CM | POA: Diagnosis not present

## 2023-02-25 DIAGNOSIS — Z1331 Encounter for screening for depression: Secondary | ICD-10-CM | POA: Diagnosis not present

## 2023-02-25 DIAGNOSIS — N904 Leukoplakia of vulva: Secondary | ICD-10-CM | POA: Diagnosis not present

## 2023-02-25 DIAGNOSIS — Z0000 Encounter for general adult medical examination without abnormal findings: Secondary | ICD-10-CM | POA: Diagnosis not present

## 2023-02-25 DIAGNOSIS — I10 Essential (primary) hypertension: Secondary | ICD-10-CM | POA: Diagnosis not present

## 2023-02-25 DIAGNOSIS — M5430 Sciatica, unspecified side: Secondary | ICD-10-CM | POA: Diagnosis not present

## 2023-02-25 DIAGNOSIS — Z8673 Personal history of transient ischemic attack (TIA), and cerebral infarction without residual deficits: Secondary | ICD-10-CM | POA: Diagnosis not present

## 2023-02-25 DIAGNOSIS — E1169 Type 2 diabetes mellitus with other specified complication: Secondary | ICD-10-CM | POA: Diagnosis not present

## 2023-03-01 DIAGNOSIS — M545 Low back pain, unspecified: Secondary | ICD-10-CM | POA: Diagnosis not present

## 2023-03-04 DIAGNOSIS — M48061 Spinal stenosis, lumbar region without neurogenic claudication: Secondary | ICD-10-CM | POA: Diagnosis not present

## 2023-03-07 DIAGNOSIS — M545 Low back pain, unspecified: Secondary | ICD-10-CM | POA: Diagnosis not present

## 2023-03-11 DIAGNOSIS — M48061 Spinal stenosis, lumbar region without neurogenic claudication: Secondary | ICD-10-CM | POA: Diagnosis not present

## 2023-03-13 DIAGNOSIS — M48061 Spinal stenosis, lumbar region without neurogenic claudication: Secondary | ICD-10-CM | POA: Diagnosis not present

## 2023-03-21 ENCOUNTER — Encounter (HOSPITAL_COMMUNITY): Payer: Self-pay

## 2023-03-21 ENCOUNTER — Emergency Department (HOSPITAL_COMMUNITY): Payer: Medicare Other

## 2023-03-21 ENCOUNTER — Other Ambulatory Visit: Payer: Self-pay

## 2023-03-21 ENCOUNTER — Emergency Department (HOSPITAL_COMMUNITY)
Admission: EM | Admit: 2023-03-21 | Discharge: 2023-03-22 | Disposition: A | Discharge status: Home / Self Care | Payer: Medicare Other | Attending: Emergency Medicine | Admitting: Emergency Medicine

## 2023-03-21 DIAGNOSIS — Z79899 Other long term (current) drug therapy: Secondary | ICD-10-CM | POA: Insufficient documentation

## 2023-03-21 DIAGNOSIS — Z7984 Long term (current) use of oral hypoglycemic drugs: Secondary | ICD-10-CM | POA: Insufficient documentation

## 2023-03-21 DIAGNOSIS — M47814 Spondylosis without myelopathy or radiculopathy, thoracic region: Secondary | ICD-10-CM | POA: Diagnosis not present

## 2023-03-21 DIAGNOSIS — R32 Unspecified urinary incontinence: Secondary | ICD-10-CM | POA: Diagnosis not present

## 2023-03-21 DIAGNOSIS — M4804 Spinal stenosis, thoracic region: Secondary | ICD-10-CM | POA: Diagnosis not present

## 2023-03-21 DIAGNOSIS — M4805 Spinal stenosis, thoracolumbar region: Secondary | ICD-10-CM | POA: Diagnosis not present

## 2023-03-21 DIAGNOSIS — J9811 Atelectasis: Secondary | ICD-10-CM | POA: Insufficient documentation

## 2023-03-21 DIAGNOSIS — R159 Full incontinence of feces: Secondary | ICD-10-CM | POA: Diagnosis not present

## 2023-03-21 DIAGNOSIS — N281 Cyst of kidney, acquired: Secondary | ICD-10-CM | POA: Diagnosis not present

## 2023-03-21 DIAGNOSIS — R200 Anesthesia of skin: Secondary | ICD-10-CM | POA: Diagnosis not present

## 2023-03-21 DIAGNOSIS — K449 Diaphragmatic hernia without obstruction or gangrene: Secondary | ICD-10-CM | POA: Diagnosis not present

## 2023-03-21 DIAGNOSIS — M48061 Spinal stenosis, lumbar region without neurogenic claudication: Secondary | ICD-10-CM | POA: Diagnosis not present

## 2023-03-21 DIAGNOSIS — M47816 Spondylosis without myelopathy or radiculopathy, lumbar region: Secondary | ICD-10-CM | POA: Diagnosis not present

## 2023-03-21 DIAGNOSIS — Z7901 Long term (current) use of anticoagulants: Secondary | ICD-10-CM | POA: Diagnosis not present

## 2023-03-21 DIAGNOSIS — M545 Low back pain, unspecified: Secondary | ICD-10-CM | POA: Diagnosis not present

## 2023-03-21 DIAGNOSIS — I491 Atrial premature depolarization: Secondary | ICD-10-CM | POA: Insufficient documentation

## 2023-03-21 DIAGNOSIS — E119 Type 2 diabetes mellitus without complications: Secondary | ICD-10-CM | POA: Diagnosis not present

## 2023-03-21 DIAGNOSIS — I10 Essential (primary) hypertension: Secondary | ICD-10-CM | POA: Insufficient documentation

## 2023-03-21 DIAGNOSIS — R339 Retention of urine, unspecified: Secondary | ICD-10-CM | POA: Diagnosis not present

## 2023-03-21 DIAGNOSIS — M7989 Other specified soft tissue disorders: Secondary | ICD-10-CM | POA: Diagnosis not present

## 2023-03-21 DIAGNOSIS — M5126 Other intervertebral disc displacement, lumbar region: Secondary | ICD-10-CM | POA: Diagnosis not present

## 2023-03-21 DIAGNOSIS — R0989 Other specified symptoms and signs involving the circulatory and respiratory systems: Secondary | ICD-10-CM | POA: Diagnosis not present

## 2023-03-21 LAB — URINALYSIS, W/ REFLEX TO CULTURE (INFECTION SUSPECTED)
Bacteria, UA: NONE SEEN
Bilirubin Urine: NEGATIVE
Glucose, UA: NEGATIVE mg/dL
Hgb urine dipstick: NEGATIVE
Ketones, ur: NEGATIVE mg/dL
Leukocytes,Ua: NEGATIVE
Nitrite: NEGATIVE
Protein, ur: NEGATIVE mg/dL
Specific Gravity, Urine: 1.028 (ref 1.005–1.030)
pH: 5 (ref 5.0–8.0)

## 2023-03-21 LAB — BASIC METABOLIC PANEL
Anion gap: 9 (ref 5–15)
BUN: 22 mg/dL (ref 8–23)
CO2: 18 mmol/L — ABNORMAL LOW (ref 22–32)
Calcium: 8.2 mg/dL — ABNORMAL LOW (ref 8.9–10.3)
Chloride: 111 mmol/L (ref 98–111)
Creatinine, Ser: 0.6 mg/dL (ref 0.44–1.00)
GFR, Estimated: >60 mL/min (ref >60)
Glucose, Bld: 151 mg/dL — ABNORMAL HIGH (ref 70–99)
Potassium: 4.7 mmol/L (ref 3.5–5.1)
Sodium: 138 mmol/L (ref 135–145)

## 2023-03-21 LAB — CBC
HCT: 47.9 % — ABNORMAL HIGH (ref 36.0–46.0)
Hemoglobin: 15.1 g/dL — ABNORMAL HIGH (ref 12.0–15.0)
MCH: 30.1 pg (ref 26.0–34.0)
MCHC: 31.5 g/dL (ref 30.0–36.0)
MCV: 95.6 fL (ref 80.0–100.0)
Platelets: 184 10*3/uL (ref 150–400)
RBC: 5.01 MIL/uL (ref 3.87–5.11)
RDW: 14.4 % (ref 11.5–15.5)
WBC: 8.5 10*3/uL (ref 4.0–10.5)
nRBC: 0 % (ref 0.0–0.2)

## 2023-03-21 LAB — BRAIN NATRIURETIC PEPTIDE: B Natriuretic Peptide: 28.2 pg/mL (ref 0.0–100.0)

## 2023-03-21 MED ORDER — MORPHINE SULFATE (PF) 4 MG/ML IV SOLN
4.0000 mg | Freq: Once | INTRAVENOUS | Status: AC
  Administered 2023-03-21: 4 mg via INTRAVENOUS
  Filled 2023-03-21: qty 1

## 2023-03-21 NOTE — ED Notes (Signed)
 Pt ambulated from room to nurse's station with walker, small shuffling gait, states legs "are asleep".  Denies pain but needed minimal assistance along with walker.  MD notified.

## 2023-03-21 NOTE — ED Provider Notes (Signed)
 [facility] DEPARTMENT AT [facility] Provider Note   CSN: [id number] Arrival date & time: 03/21/23  1126     History {Add pertinent medical, surgical, social history, OB history to HPI:1} Chief Complaint  Patient presents with   Tingling    Brandi Beck is a 89 y.o. female.  HPI   Patient has a history of diabetes as well as hypertension and hyperlipidemia.  Patient's had chronic back issues.  She has been diagnosed with spinal stenosis.  Patient has previously had spinal epidural injections with improvement.  Patient states she is been seeing an orthopedic doctor.  She had an outpatient MRI on February 13.  It showed multilevel spondylosis.  She had severe central canal stenosis at L3 and L4.  She had moderate severe central canal and bilateral lateral recess narrowing at L2-L3 and moderate canal stenosis at L1-L2.  Patient also has moderate to severe foraminal narrowing at L2-L3 L3-L4 and L4-L5.  Patient states she had an epidural injection recently and her symptoms have been worsening.  She has also taken steroids.  She states she is having increasing leg numbness.  She has numbness from the waist down.  She has been having episodes of fecal and urinary incontinence.  The patient has also noticed urinary frequency and leg swelling  Home Medications Prior to Admission medications   Medication Sig Start Date End Date Taking? Authorizing Provider  acetaminophen (TYLENOL) 500 MG tablet Take 1,000 mg by mouth every 8 (eight) hours as needed for mild pain or moderate pain.    [provider]  allopurinol (ZYLOPRIM) 100 MG tablet Take 200 mg by mouth daily.    [provider]  atorvastatin (LIPITOR) 40 MG tablet Take 40 mg by mouth daily.    [provider]  carvedilol (COREG) 12.5 MG tablet Take 12.5 mg by mouth daily.    [provider]  clobetasol ointment (TEMOVATE) 0.05 % Apply 1 application topically daily as needed (sclerosis).     [provider]  clopidogrel (PLAVIX) 75 MG tablet Take 75 mg by mouth daily.    [provider]  EC-RX Testosterone 0.2 % CREA APPLY 1/2 TEASPOONFUL DAILY. 05/17/20   [provider]  fluocinonide cream (LIDEX) 0.05 % Apply 1 application topically daily as needed (rash).    [provider]  fluticasone (FLONASE) 50 MCG/ACT nasal spray Place 1 spray into both nostrils daily as needed for allergies or rhinitis. Patient not taking: Reported on 05/24/2020    [provider]  gabapentin (NEURONTIN) 100 MG capsule Take 100 mg by mouth daily. 05/23/20   [provider]  HYDROcodone-acetaminophen (NORCO/VICODIN) 5-325 MG per tablet Take 1 tablet by mouth every 6 (six) hours as needed for moderate pain or severe pain. Patient not taking: Reported on 05/24/2020 08/25/14   Jeralyn Bennett, MD  losartan (COZAAR) 25 MG tablet Take 25 mg by mouth daily.    [provider]  metFORMIN (GLUCOPHAGE-XR) 500 MG 24 hr tablet Take by mouth. 05/09/20   [provider]  NIFEdipine (PROCARDIA-XL/ADALAT-CC/NIFEDICAL-XL) 30 MG 24 hr tablet Take 30 mg by mouth daily.    [provider]  Omega-3 Fatty Acids (FISH OIL PO) Take 1 capsule by mouth daily.    [provider]  propranolol (INDERAL) 20 MG tablet Take 20 mg by mouth 2 (two) times daily. 04/04/20   [provider]  sitaGLIPtin (JANUVIA) 50 MG tablet Take 50 mg by mouth daily.    [provider]  spironolactone (ALDACTONE) 25 MG tablet Take 25 mg by mouth daily.    [provider]      Allergies    Citrus, Peanuts [peanut oil], Aspirin, and Niacin and related    Review of Systems   Review of Systems  Physical Exam Updated Vital Signs BP (!) 140/83 (BP Location: Right Arm)   Pulse 91   Temp 98 F (36.7 C) (Oral)   Resp 18   Ht 1.524 m (5')   Wt 72.6 kg   SpO2 93%   BMI 31.25 kg/m  Physical Exam Vitals and nursing note reviewed.  Constitutional:       General: She is not in acute distress.    Appearance: She is well-developed.  HENT:     Head: Normocephalic and atraumatic.     Right Ear: External ear normal.     Left Ear: External ear normal.  Eyes:     General: No scleral icterus.       Right eye: No discharge.        Left eye: No discharge.     Conjunctiva/sclera: Conjunctivae normal.  Neck:     Trachea: No tracheal deviation.  Cardiovascular:     Rate and Rhythm: Normal rate and regular rhythm.  Pulmonary:     Effort: Pulmonary effort is normal. No respiratory distress.     Breath sounds: Normal breath sounds. No stridor. No wheezing or rales.  Abdominal:     General: Bowel sounds are normal. There is no distension.     Palpations: Abdomen is soft.     Tenderness: There is no abdominal tenderness. There is no guarding or rebound.  Genitourinary:    Comments: Stool in the rectal vault, diminished rectal tone Musculoskeletal:        General: No tenderness or deformity.     Cervical back: Neck supple.  Skin:    General: Skin is warm and dry.     Findings: No rash.  Neurological:     General: No focal deficit present.     Mental Status: She is alert.     Cranial Nerves: No cranial nerve deficit, dysarthria or facial asymmetry.     Sensory: No sensory deficit.     Motor: No abnormal muscle tone or seizure activity.     Coordination: Coordination normal.     Comments: Patient subjectively has decreased sensation below the waist but is able to feel me touch bilaterally in her lower extremities.  She is able to plantarflex and dorsiflex,  Psychiatric:        Mood and Affect: Mood normal.     ED Results / Procedures / Treatments   Labs (all labs ordered are listed, but only abnormal results are displayed) Labs Reviewed  CBC  BASIC METABOLIC PANEL  BRAIN NATRIURETIC PEPTIDE  URINALYSIS, W/ REFLEX TO CULTURE (INFECTION SUSPECTED)    EKG None  Radiology No results found.  Procedures Procedures  {Document  cardiac monitor, telemetry assessment procedure when appropriate:1}  Medications Ordered in ED Medications - No data to display  ED Course/ Medical Decision Making/ A&P   {   Click here for ABCD2, HEART and other calculatorsREFRESH Note before signing :1}                              Medical Decision Making Amount and/or Complexity of Data Reviewed Labs: ordered. Radiology: ordered.   ***  {Document critical care time when appropriate:1} {Document review  of labs and clinical decision tools ie heart score, Chads2Vasc2 etc:1}  {Document your independent review of radiology images, and any outside records:1} {Document your discussion with family members, caretakers, and with consultants:1} {Document social determinants of health affecting pt's care:1} {Document your decision making why or why not admission, treatments were needed:1} Final Clinical Impression(s) / ED Diagnoses Final diagnoses:  None    Rx / DC Orders ED Discharge Orders     None

## 2023-03-21 NOTE — ED Notes (Signed)
 Patient transported to MRI

## 2023-03-21 NOTE — ED Notes (Signed)
 Pt returned from MRI

## 2023-03-21 NOTE — ED Provider Notes (Signed)
  Physical Exam  BP (!) 145/83 (BP Location: Left Arm)   Pulse 78   Temp 98.7 F (37.1 C) (Oral)   Resp 16   Ht 5' (1.524 m)   Wt 72.6 kg   SpO2 91%   BMI 31.25 kg/m   Physical Exam  Procedures  Procedures  ED Course / MDM   Clinical Course as of 03/21/23 1659  Thu Mar 21, 2023  1348 Case discussed with Dr Conchita Paris.  We reviewed recent MRI.  Does not feel repeat MRI is needed at this time. [JK]  1618 Chest x-ray does show low lung volumes, central interstitial prominence could be hyperal violation or central pulmonary vascular congestion [JK]    Clinical Course User Index [JK] Linwood Dibbles, MD   Medical Decision Making Amount and/or Complexity of Data Reviewed Labs: ordered. Radiology: ordered.  Risk Prescription drug management.   ***  Presented with back pain which is ongoing, outpatient MRI 2/13, increasing numbness waist-down, incontinence, pain.  Was discussed with Dr. Conchita Paris, does not feel repeat MRI indicated, is going to see [facility] tomorrow in the office.  Also reported leg swelling, dysuria

## 2023-03-21 NOTE — ED Triage Notes (Signed)
 Pt had steroid injection in back 2/21. Pt states since injection, she has had bilateral leg numbness, although pt can feel when this RN touches legs, and bladder incontinence. Pt is having difficulty walking.

## 2023-03-21 NOTE — ED Notes (Signed)
 Pt maintaining 95% oxygen on 1L [place].  Oxygen turned off at this time per MD request.

## 2023-03-21 NOTE — Progress Notes (Addendum)
 I was called about this patient presenting with exacerbation of chronic back pain and leg paresthesias. Per ED, she presented with worsening of back pain and bilateral leg paresthesias from waist down. She is also c/o urinary and fecal incontinence. Per report, she has intact PF/DF strength and some (perhaps "decreased") rectal tone. I have reviewed outpatient EMR and MRI done today. She has multiple outpatient visits at [facility] for exacerbations of similar back and bilateral leg radiculopathy. She has undergone ESI recently. Her MRI today again reveals severe multifactorial stenosis at L2-3, L3-4, L4-5. Tspine MRI has some motion artifact but no obvious high-grade stenosis/spinal cord compression.  It appears to me she has exacerbation of chronic symptoms rather than acute cauda equina syndrome. Furthermore, she is likely not a good surgical candidate primarily due to her age and multi-level disease. I therefore think it reasonable for her to be discharged from a neurosurgical standpoint for further discussion in the outpatient clinic which was already scheduled in our office for tomorrow am.  Lisbeth Renshaw, MD [facility]

## 2023-03-21 NOTE — ED Notes (Addendum)
 Pt oxygen turned down to 1L [place] per MD order.

## 2023-03-21 NOTE — Care Management (Addendum)
 Transition of Care [facility]) - Emergency Department Mini Assessment   Patient Details  Name: Brandi Beck MRN: [id number] Date of Birth: April 08, 1928  Transition of Care [facility]) CM/SW Contact:    Lavenia Atlas, RN Phone Number: 03/21/2023, 3:02 PM   Clinical Narrative: Received secure chat from RN Alyssa who reports patient needs assistance with [facility] services. Per chart review patient currently at [facility] ED for bilateral leg numbness with a PMH: spinal stenosis. This RNCM spoke with patient and family at bedside. Patient reports no previous HH services and prior to admission has DME: [employer]. Patient reports she needs a wheelchair due to having difficulty getting around recently. This RNCM offered choice for [facility] services and DME ([email or web address] patient will accept any agency that accepts her insurance. Patient reports she recently started services with [facility] for private duty nursing services.  This RNCM spoke with Elnita Maxwell with [facility] who will follow patient post discharge for HHPT/OT, [facility] with start of care tomorrow.  This RNCM spoke with Jermaine with [employer] who will follow patient for 18 inch lightweight wheelchair, to be delivered to patient's home.    Notified MD, RN and patient's husband Marisue Humble, also attached HH/DME vendors to AVS.   No additional TOC needs at this time.   -5:40pm This RNCM received message from [place] w/[employer] inquiring about complete MD progress note. This RNCM advised Elnita Maxwell,  awaiting MRI results and MD will decide plan for patient, unsure of discharge. Will advise Elnita Maxwell once we know more.   ED Mini Assessment: What brought you to the Emergency Department? : Patient has complaint of bilateral leg numbness  Barriers to Discharge: Barriers Resolved  Barrier interventions: coordinate HH services and DME  Means of departure: Car  Interventions which prevented an admission or readmission: Home Health Consult or Services, Other (must enter comment) (Coordinating  DME: light weight 18 inch wheelchair)    Patient Contact and Communications        ,          Patient states their goals for this hospitalization and ongoing recovery are:: To feel better CMS Medicare.gov Compare Post Acute Care list provided to:: Patient Choice offered to / list presented to : Patient, Spouse  Admission diagnosis:  Back Pain; Lower Extremity Numbess; Hypergylcemia Patient Active Problem List   Diagnosis Date Noted   Near syncope 08/24/2014   Essential hypertension 08/24/2014   Dyslipidemia 08/24/2014   Fracture of rib of right side 08/24/2014   Controlled diabetes mellitus type II without complication (HCC) 08/24/2014   PCP:  Alysia Penna, MD Pharmacy:   [facility] # [address], [place] - [address] [place] [address] Phone: [phone] Fax: [phone]  [facility] [place], [place] - [address] [place] [address] Phone: [phone] Fax: [phone]

## 2023-03-22 DIAGNOSIS — M48062 Spinal stenosis, lumbar region with neurogenic claudication: Secondary | ICD-10-CM | POA: Diagnosis not present

## 2023-03-22 DIAGNOSIS — Z6827 Body mass index (BMI) 27.0-27.9, adult: Secondary | ICD-10-CM | POA: Diagnosis not present

## 2023-03-22 NOTE — Discharge Instructions (Signed)
 You will need to follow-up with neurosurgery in the office for further management.  We have arranged for home health to come to your home.  If any of your symptoms worsen, please return to the emergency department.

## 2023-03-22 NOTE — ED Notes (Signed)
 Pt placed on 1L [place] while sleeping d/t oxygen level 88-90% on RA.

## 2023-03-23 DIAGNOSIS — M47815 Spondylosis without myelopathy or radiculopathy, thoracolumbar region: Secondary | ICD-10-CM | POA: Diagnosis not present

## 2023-03-23 DIAGNOSIS — Z8672 Personal history of thrombophlebitis: Secondary | ICD-10-CM | POA: Diagnosis not present

## 2023-03-23 DIAGNOSIS — I10 Essential (primary) hypertension: Secondary | ICD-10-CM | POA: Diagnosis not present

## 2023-03-23 DIAGNOSIS — M48061 Spinal stenosis, lumbar region without neurogenic claudication: Secondary | ICD-10-CM | POA: Diagnosis not present

## 2023-03-23 DIAGNOSIS — Z7902 Long term (current) use of antithrombotics/antiplatelets: Secondary | ICD-10-CM | POA: Diagnosis not present

## 2023-03-23 DIAGNOSIS — Z7984 Long term (current) use of oral hypoglycemic drugs: Secondary | ICD-10-CM | POA: Diagnosis not present

## 2023-03-23 DIAGNOSIS — M109 Gout, unspecified: Secondary | ICD-10-CM | POA: Diagnosis not present

## 2023-03-23 DIAGNOSIS — N281 Cyst of kidney, acquired: Secondary | ICD-10-CM | POA: Diagnosis not present

## 2023-03-23 DIAGNOSIS — E119 Type 2 diabetes mellitus without complications: Secondary | ICD-10-CM | POA: Diagnosis not present

## 2023-03-23 DIAGNOSIS — R339 Retention of urine, unspecified: Secondary | ICD-10-CM | POA: Diagnosis not present

## 2023-03-23 DIAGNOSIS — Z466 Encounter for fitting and adjustment of urinary device: Secondary | ICD-10-CM | POA: Diagnosis not present

## 2023-03-23 DIAGNOSIS — E785 Hyperlipidemia, unspecified: Secondary | ICD-10-CM | POA: Diagnosis not present

## 2023-03-27 DIAGNOSIS — R0989 Other specified symptoms and signs involving the circulatory and respiratory systems: Secondary | ICD-10-CM | POA: Diagnosis not present

## 2023-03-27 DIAGNOSIS — I10 Essential (primary) hypertension: Secondary | ICD-10-CM | POA: Diagnosis not present

## 2023-03-27 DIAGNOSIS — E119 Type 2 diabetes mellitus without complications: Secondary | ICD-10-CM | POA: Diagnosis not present

## 2023-03-27 DIAGNOSIS — M47815 Spondylosis without myelopathy or radiculopathy, thoracolumbar region: Secondary | ICD-10-CM | POA: Diagnosis not present

## 2023-03-27 DIAGNOSIS — M48062 Spinal stenosis, lumbar region with neurogenic claudication: Secondary | ICD-10-CM | POA: Diagnosis not present

## 2023-03-27 DIAGNOSIS — R635 Abnormal weight gain: Secondary | ICD-10-CM | POA: Diagnosis not present

## 2023-03-27 DIAGNOSIS — R339 Retention of urine, unspecified: Secondary | ICD-10-CM | POA: Diagnosis not present

## 2023-03-27 DIAGNOSIS — N1831 Chronic kidney disease, stage 3a: Secondary | ICD-10-CM | POA: Diagnosis not present

## 2023-03-27 DIAGNOSIS — R600 Localized edema: Secondary | ICD-10-CM | POA: Diagnosis not present

## 2023-03-27 DIAGNOSIS — N281 Cyst of kidney, acquired: Secondary | ICD-10-CM | POA: Diagnosis not present

## 2023-03-27 DIAGNOSIS — M48061 Spinal stenosis, lumbar region without neurogenic claudication: Secondary | ICD-10-CM | POA: Diagnosis not present

## 2023-03-28 DIAGNOSIS — E119 Type 2 diabetes mellitus without complications: Secondary | ICD-10-CM | POA: Diagnosis not present

## 2023-03-28 DIAGNOSIS — M48061 Spinal stenosis, lumbar region without neurogenic claudication: Secondary | ICD-10-CM | POA: Diagnosis not present

## 2023-03-28 DIAGNOSIS — N281 Cyst of kidney, acquired: Secondary | ICD-10-CM | POA: Diagnosis not present

## 2023-03-28 DIAGNOSIS — I10 Essential (primary) hypertension: Secondary | ICD-10-CM | POA: Diagnosis not present

## 2023-03-28 DIAGNOSIS — R339 Retention of urine, unspecified: Secondary | ICD-10-CM | POA: Diagnosis not present

## 2023-03-28 DIAGNOSIS — M47815 Spondylosis without myelopathy or radiculopathy, thoracolumbar region: Secondary | ICD-10-CM | POA: Diagnosis not present

## 2023-03-29 DIAGNOSIS — E119 Type 2 diabetes mellitus without complications: Secondary | ICD-10-CM | POA: Diagnosis not present

## 2023-03-29 DIAGNOSIS — N281 Cyst of kidney, acquired: Secondary | ICD-10-CM | POA: Diagnosis not present

## 2023-03-29 DIAGNOSIS — M48061 Spinal stenosis, lumbar region without neurogenic claudication: Secondary | ICD-10-CM | POA: Diagnosis not present

## 2023-03-29 DIAGNOSIS — I10 Essential (primary) hypertension: Secondary | ICD-10-CM | POA: Diagnosis not present

## 2023-03-29 DIAGNOSIS — R339 Retention of urine, unspecified: Secondary | ICD-10-CM | POA: Diagnosis not present

## 2023-03-29 DIAGNOSIS — M47815 Spondylosis without myelopathy or radiculopathy, thoracolumbar region: Secondary | ICD-10-CM | POA: Diagnosis not present

## 2023-04-02 DIAGNOSIS — N281 Cyst of kidney, acquired: Secondary | ICD-10-CM | POA: Diagnosis not present

## 2023-04-02 DIAGNOSIS — R338 Other retention of urine: Secondary | ICD-10-CM | POA: Diagnosis not present

## 2023-04-02 DIAGNOSIS — R339 Retention of urine, unspecified: Secondary | ICD-10-CM | POA: Diagnosis not present

## 2023-04-02 DIAGNOSIS — M47815 Spondylosis without myelopathy or radiculopathy, thoracolumbar region: Secondary | ICD-10-CM | POA: Diagnosis not present

## 2023-04-02 DIAGNOSIS — M48061 Spinal stenosis, lumbar region without neurogenic claudication: Secondary | ICD-10-CM | POA: Diagnosis not present

## 2023-04-02 DIAGNOSIS — I10 Essential (primary) hypertension: Secondary | ICD-10-CM | POA: Diagnosis not present

## 2023-04-02 DIAGNOSIS — E119 Type 2 diabetes mellitus without complications: Secondary | ICD-10-CM | POA: Diagnosis not present

## 2023-04-04 DIAGNOSIS — N281 Cyst of kidney, acquired: Secondary | ICD-10-CM | POA: Diagnosis not present

## 2023-04-04 DIAGNOSIS — R339 Retention of urine, unspecified: Secondary | ICD-10-CM | POA: Diagnosis not present

## 2023-04-04 DIAGNOSIS — E119 Type 2 diabetes mellitus without complications: Secondary | ICD-10-CM | POA: Diagnosis not present

## 2023-04-04 DIAGNOSIS — M47815 Spondylosis without myelopathy or radiculopathy, thoracolumbar region: Secondary | ICD-10-CM | POA: Diagnosis not present

## 2023-04-04 DIAGNOSIS — M48061 Spinal stenosis, lumbar region without neurogenic claudication: Secondary | ICD-10-CM | POA: Diagnosis not present

## 2023-04-04 DIAGNOSIS — I10 Essential (primary) hypertension: Secondary | ICD-10-CM | POA: Diagnosis not present

## 2023-04-05 DIAGNOSIS — E119 Type 2 diabetes mellitus without complications: Secondary | ICD-10-CM | POA: Diagnosis not present

## 2023-04-05 DIAGNOSIS — R339 Retention of urine, unspecified: Secondary | ICD-10-CM | POA: Diagnosis not present

## 2023-04-05 DIAGNOSIS — N281 Cyst of kidney, acquired: Secondary | ICD-10-CM | POA: Diagnosis not present

## 2023-04-05 DIAGNOSIS — M47815 Spondylosis without myelopathy or radiculopathy, thoracolumbar region: Secondary | ICD-10-CM | POA: Diagnosis not present

## 2023-04-05 DIAGNOSIS — I10 Essential (primary) hypertension: Secondary | ICD-10-CM | POA: Diagnosis not present

## 2023-04-05 DIAGNOSIS — M48061 Spinal stenosis, lumbar region without neurogenic claudication: Secondary | ICD-10-CM | POA: Diagnosis not present

## 2023-04-09 DIAGNOSIS — M47815 Spondylosis without myelopathy or radiculopathy, thoracolumbar region: Secondary | ICD-10-CM | POA: Diagnosis not present

## 2023-04-09 DIAGNOSIS — M48061 Spinal stenosis, lumbar region without neurogenic claudication: Secondary | ICD-10-CM | POA: Diagnosis not present

## 2023-04-09 DIAGNOSIS — E785 Hyperlipidemia, unspecified: Secondary | ICD-10-CM | POA: Diagnosis not present

## 2023-04-09 DIAGNOSIS — I10 Essential (primary) hypertension: Secondary | ICD-10-CM | POA: Diagnosis not present

## 2023-04-09 DIAGNOSIS — Z7984 Long term (current) use of oral hypoglycemic drugs: Secondary | ICD-10-CM | POA: Diagnosis not present

## 2023-04-09 DIAGNOSIS — Z8672 Personal history of thrombophlebitis: Secondary | ICD-10-CM | POA: Diagnosis not present

## 2023-04-09 DIAGNOSIS — N281 Cyst of kidney, acquired: Secondary | ICD-10-CM | POA: Diagnosis not present

## 2023-04-09 DIAGNOSIS — Z466 Encounter for fitting and adjustment of urinary device: Secondary | ICD-10-CM | POA: Diagnosis not present

## 2023-04-09 DIAGNOSIS — E119 Type 2 diabetes mellitus without complications: Secondary | ICD-10-CM | POA: Diagnosis not present

## 2023-04-09 DIAGNOSIS — M109 Gout, unspecified: Secondary | ICD-10-CM | POA: Diagnosis not present

## 2023-04-09 DIAGNOSIS — Z7902 Long term (current) use of antithrombotics/antiplatelets: Secondary | ICD-10-CM | POA: Diagnosis not present

## 2023-04-09 DIAGNOSIS — R339 Retention of urine, unspecified: Secondary | ICD-10-CM | POA: Diagnosis not present

## 2023-04-11 DIAGNOSIS — M47815 Spondylosis without myelopathy or radiculopathy, thoracolumbar region: Secondary | ICD-10-CM | POA: Diagnosis not present

## 2023-04-11 DIAGNOSIS — M48061 Spinal stenosis, lumbar region without neurogenic claudication: Secondary | ICD-10-CM | POA: Diagnosis not present

## 2023-04-11 DIAGNOSIS — I10 Essential (primary) hypertension: Secondary | ICD-10-CM | POA: Diagnosis not present

## 2023-04-11 DIAGNOSIS — N281 Cyst of kidney, acquired: Secondary | ICD-10-CM | POA: Diagnosis not present

## 2023-04-11 DIAGNOSIS — R339 Retention of urine, unspecified: Secondary | ICD-10-CM | POA: Diagnosis not present

## 2023-04-11 DIAGNOSIS — E119 Type 2 diabetes mellitus without complications: Secondary | ICD-10-CM | POA: Diagnosis not present

## 2023-04-12 DIAGNOSIS — I10 Essential (primary) hypertension: Secondary | ICD-10-CM | POA: Diagnosis not present

## 2023-04-12 DIAGNOSIS — M48061 Spinal stenosis, lumbar region without neurogenic claudication: Secondary | ICD-10-CM | POA: Diagnosis not present

## 2023-04-12 DIAGNOSIS — E119 Type 2 diabetes mellitus without complications: Secondary | ICD-10-CM | POA: Diagnosis not present

## 2023-04-12 DIAGNOSIS — R339 Retention of urine, unspecified: Secondary | ICD-10-CM | POA: Diagnosis not present

## 2023-04-12 DIAGNOSIS — N281 Cyst of kidney, acquired: Secondary | ICD-10-CM | POA: Diagnosis not present

## 2023-04-12 DIAGNOSIS — M47815 Spondylosis without myelopathy or radiculopathy, thoracolumbar region: Secondary | ICD-10-CM | POA: Diagnosis not present

## 2023-04-15 DIAGNOSIS — R338 Other retention of urine: Secondary | ICD-10-CM | POA: Diagnosis not present

## 2023-04-16 DIAGNOSIS — R339 Retention of urine, unspecified: Secondary | ICD-10-CM | POA: Diagnosis not present

## 2023-04-16 DIAGNOSIS — M47815 Spondylosis without myelopathy or radiculopathy, thoracolumbar region: Secondary | ICD-10-CM | POA: Diagnosis not present

## 2023-04-16 DIAGNOSIS — I10 Essential (primary) hypertension: Secondary | ICD-10-CM | POA: Diagnosis not present

## 2023-04-16 DIAGNOSIS — M48061 Spinal stenosis, lumbar region without neurogenic claudication: Secondary | ICD-10-CM | POA: Diagnosis not present

## 2023-04-16 DIAGNOSIS — N281 Cyst of kidney, acquired: Secondary | ICD-10-CM | POA: Diagnosis not present

## 2023-04-16 DIAGNOSIS — E119 Type 2 diabetes mellitus without complications: Secondary | ICD-10-CM | POA: Diagnosis not present

## 2023-04-17 DIAGNOSIS — M48061 Spinal stenosis, lumbar region without neurogenic claudication: Secondary | ICD-10-CM | POA: Diagnosis not present

## 2023-04-17 DIAGNOSIS — R339 Retention of urine, unspecified: Secondary | ICD-10-CM | POA: Diagnosis not present

## 2023-04-17 DIAGNOSIS — N281 Cyst of kidney, acquired: Secondary | ICD-10-CM | POA: Diagnosis not present

## 2023-04-17 DIAGNOSIS — I10 Essential (primary) hypertension: Secondary | ICD-10-CM | POA: Diagnosis not present

## 2023-04-17 DIAGNOSIS — E119 Type 2 diabetes mellitus without complications: Secondary | ICD-10-CM | POA: Diagnosis not present

## 2023-04-17 DIAGNOSIS — M47815 Spondylosis without myelopathy or radiculopathy, thoracolumbar region: Secondary | ICD-10-CM | POA: Diagnosis not present

## 2023-04-18 DIAGNOSIS — M47815 Spondylosis without myelopathy or radiculopathy, thoracolumbar region: Secondary | ICD-10-CM | POA: Diagnosis not present

## 2023-04-18 DIAGNOSIS — N281 Cyst of kidney, acquired: Secondary | ICD-10-CM | POA: Diagnosis not present

## 2023-04-18 DIAGNOSIS — I10 Essential (primary) hypertension: Secondary | ICD-10-CM | POA: Diagnosis not present

## 2023-04-18 DIAGNOSIS — E119 Type 2 diabetes mellitus without complications: Secondary | ICD-10-CM | POA: Diagnosis not present

## 2023-04-18 DIAGNOSIS — M48061 Spinal stenosis, lumbar region without neurogenic claudication: Secondary | ICD-10-CM | POA: Diagnosis not present

## 2023-04-18 DIAGNOSIS — R339 Retention of urine, unspecified: Secondary | ICD-10-CM | POA: Diagnosis not present

## 2023-04-22 DIAGNOSIS — R339 Retention of urine, unspecified: Secondary | ICD-10-CM | POA: Diagnosis not present

## 2023-04-22 DIAGNOSIS — M109 Gout, unspecified: Secondary | ICD-10-CM | POA: Diagnosis not present

## 2023-04-22 DIAGNOSIS — M48061 Spinal stenosis, lumbar region without neurogenic claudication: Secondary | ICD-10-CM | POA: Diagnosis not present

## 2023-04-22 DIAGNOSIS — E785 Hyperlipidemia, unspecified: Secondary | ICD-10-CM | POA: Diagnosis not present

## 2023-04-22 DIAGNOSIS — N281 Cyst of kidney, acquired: Secondary | ICD-10-CM | POA: Diagnosis not present

## 2023-04-22 DIAGNOSIS — I10 Essential (primary) hypertension: Secondary | ICD-10-CM | POA: Diagnosis not present

## 2023-04-22 DIAGNOSIS — Z8672 Personal history of thrombophlebitis: Secondary | ICD-10-CM | POA: Diagnosis not present

## 2023-04-22 DIAGNOSIS — Z6825 Body mass index (BMI) 25.0-25.9, adult: Secondary | ICD-10-CM | POA: Diagnosis not present

## 2023-04-22 DIAGNOSIS — Z7984 Long term (current) use of oral hypoglycemic drugs: Secondary | ICD-10-CM | POA: Diagnosis not present

## 2023-04-22 DIAGNOSIS — Z7902 Long term (current) use of antithrombotics/antiplatelets: Secondary | ICD-10-CM | POA: Diagnosis not present

## 2023-04-22 DIAGNOSIS — M47815 Spondylosis without myelopathy or radiculopathy, thoracolumbar region: Secondary | ICD-10-CM | POA: Diagnosis not present

## 2023-04-22 DIAGNOSIS — M48062 Spinal stenosis, lumbar region with neurogenic claudication: Secondary | ICD-10-CM | POA: Diagnosis not present

## 2023-04-22 DIAGNOSIS — E119 Type 2 diabetes mellitus without complications: Secondary | ICD-10-CM | POA: Diagnosis not present

## 2023-04-22 DIAGNOSIS — Z466 Encounter for fitting and adjustment of urinary device: Secondary | ICD-10-CM | POA: Diagnosis not present

## 2023-04-23 DIAGNOSIS — M48061 Spinal stenosis, lumbar region without neurogenic claudication: Secondary | ICD-10-CM | POA: Diagnosis not present

## 2023-04-23 DIAGNOSIS — M47815 Spondylosis without myelopathy or radiculopathy, thoracolumbar region: Secondary | ICD-10-CM | POA: Diagnosis not present

## 2023-04-23 DIAGNOSIS — I10 Essential (primary) hypertension: Secondary | ICD-10-CM | POA: Diagnosis not present

## 2023-04-23 DIAGNOSIS — R339 Retention of urine, unspecified: Secondary | ICD-10-CM | POA: Diagnosis not present

## 2023-04-23 DIAGNOSIS — E119 Type 2 diabetes mellitus without complications: Secondary | ICD-10-CM | POA: Diagnosis not present

## 2023-04-23 DIAGNOSIS — N281 Cyst of kidney, acquired: Secondary | ICD-10-CM | POA: Diagnosis not present

## 2023-04-24 DIAGNOSIS — M47815 Spondylosis without myelopathy or radiculopathy, thoracolumbar region: Secondary | ICD-10-CM | POA: Diagnosis not present

## 2023-04-24 DIAGNOSIS — M48061 Spinal stenosis, lumbar region without neurogenic claudication: Secondary | ICD-10-CM | POA: Diagnosis not present

## 2023-04-24 DIAGNOSIS — R339 Retention of urine, unspecified: Secondary | ICD-10-CM | POA: Diagnosis not present

## 2023-04-24 DIAGNOSIS — N281 Cyst of kidney, acquired: Secondary | ICD-10-CM | POA: Diagnosis not present

## 2023-04-24 DIAGNOSIS — I10 Essential (primary) hypertension: Secondary | ICD-10-CM | POA: Diagnosis not present

## 2023-04-24 DIAGNOSIS — E119 Type 2 diabetes mellitus without complications: Secondary | ICD-10-CM | POA: Diagnosis not present

## 2023-04-30 DIAGNOSIS — M79674 Pain in right toe(s): Secondary | ICD-10-CM | POA: Diagnosis not present

## 2023-04-30 DIAGNOSIS — L851 Acquired keratosis [keratoderma] palmaris et plantaris: Secondary | ICD-10-CM | POA: Diagnosis not present

## 2023-04-30 DIAGNOSIS — I739 Peripheral vascular disease, unspecified: Secondary | ICD-10-CM | POA: Diagnosis not present

## 2023-04-30 DIAGNOSIS — I10 Essential (primary) hypertension: Secondary | ICD-10-CM | POA: Diagnosis not present

## 2023-04-30 DIAGNOSIS — M2041 Other hammer toe(s) (acquired), right foot: Secondary | ICD-10-CM | POA: Diagnosis not present

## 2023-04-30 DIAGNOSIS — M2042 Other hammer toe(s) (acquired), left foot: Secondary | ICD-10-CM | POA: Diagnosis not present

## 2023-04-30 DIAGNOSIS — M48061 Spinal stenosis, lumbar region without neurogenic claudication: Secondary | ICD-10-CM | POA: Diagnosis not present

## 2023-04-30 DIAGNOSIS — L84 Corns and callosities: Secondary | ICD-10-CM | POA: Diagnosis not present

## 2023-04-30 DIAGNOSIS — R339 Retention of urine, unspecified: Secondary | ICD-10-CM | POA: Diagnosis not present

## 2023-04-30 DIAGNOSIS — N281 Cyst of kidney, acquired: Secondary | ICD-10-CM | POA: Diagnosis not present

## 2023-04-30 DIAGNOSIS — M2012 Hallux valgus (acquired), left foot: Secondary | ICD-10-CM | POA: Diagnosis not present

## 2023-04-30 DIAGNOSIS — E1151 Type 2 diabetes mellitus with diabetic peripheral angiopathy without gangrene: Secondary | ICD-10-CM | POA: Diagnosis not present

## 2023-04-30 DIAGNOSIS — M79675 Pain in left toe(s): Secondary | ICD-10-CM | POA: Diagnosis not present

## 2023-04-30 DIAGNOSIS — M47815 Spondylosis without myelopathy or radiculopathy, thoracolumbar region: Secondary | ICD-10-CM | POA: Diagnosis not present

## 2023-04-30 DIAGNOSIS — M2011 Hallux valgus (acquired), right foot: Secondary | ICD-10-CM | POA: Diagnosis not present

## 2023-04-30 DIAGNOSIS — B351 Tinea unguium: Secondary | ICD-10-CM | POA: Diagnosis not present

## 2023-04-30 DIAGNOSIS — E119 Type 2 diabetes mellitus without complications: Secondary | ICD-10-CM | POA: Diagnosis not present

## 2023-05-02 DIAGNOSIS — R339 Retention of urine, unspecified: Secondary | ICD-10-CM | POA: Diagnosis not present

## 2023-05-02 DIAGNOSIS — M48061 Spinal stenosis, lumbar region without neurogenic claudication: Secondary | ICD-10-CM | POA: Diagnosis not present

## 2023-05-02 DIAGNOSIS — M47815 Spondylosis without myelopathy or radiculopathy, thoracolumbar region: Secondary | ICD-10-CM | POA: Diagnosis not present

## 2023-05-02 DIAGNOSIS — I10 Essential (primary) hypertension: Secondary | ICD-10-CM | POA: Diagnosis not present

## 2023-05-02 DIAGNOSIS — N281 Cyst of kidney, acquired: Secondary | ICD-10-CM | POA: Diagnosis not present

## 2023-05-02 DIAGNOSIS — E119 Type 2 diabetes mellitus without complications: Secondary | ICD-10-CM | POA: Diagnosis not present

## 2023-05-07 DIAGNOSIS — M47815 Spondylosis without myelopathy or radiculopathy, thoracolumbar region: Secondary | ICD-10-CM | POA: Diagnosis not present

## 2023-05-07 DIAGNOSIS — E119 Type 2 diabetes mellitus without complications: Secondary | ICD-10-CM | POA: Diagnosis not present

## 2023-05-07 DIAGNOSIS — N281 Cyst of kidney, acquired: Secondary | ICD-10-CM | POA: Diagnosis not present

## 2023-05-07 DIAGNOSIS — I10 Essential (primary) hypertension: Secondary | ICD-10-CM | POA: Diagnosis not present

## 2023-05-07 DIAGNOSIS — R339 Retention of urine, unspecified: Secondary | ICD-10-CM | POA: Diagnosis not present

## 2023-05-07 DIAGNOSIS — M48061 Spinal stenosis, lumbar region without neurogenic claudication: Secondary | ICD-10-CM | POA: Diagnosis not present

## 2023-05-09 DIAGNOSIS — M47815 Spondylosis without myelopathy or radiculopathy, thoracolumbar region: Secondary | ICD-10-CM | POA: Diagnosis not present

## 2023-05-09 DIAGNOSIS — I10 Essential (primary) hypertension: Secondary | ICD-10-CM | POA: Diagnosis not present

## 2023-05-09 DIAGNOSIS — N281 Cyst of kidney, acquired: Secondary | ICD-10-CM | POA: Diagnosis not present

## 2023-05-09 DIAGNOSIS — M48061 Spinal stenosis, lumbar region without neurogenic claudication: Secondary | ICD-10-CM | POA: Diagnosis not present

## 2023-05-09 DIAGNOSIS — E119 Type 2 diabetes mellitus without complications: Secondary | ICD-10-CM | POA: Diagnosis not present

## 2023-05-09 DIAGNOSIS — R339 Retention of urine, unspecified: Secondary | ICD-10-CM | POA: Diagnosis not present

## 2023-05-17 DIAGNOSIS — R339 Retention of urine, unspecified: Secondary | ICD-10-CM | POA: Diagnosis not present

## 2023-05-17 DIAGNOSIS — M47815 Spondylosis without myelopathy or radiculopathy, thoracolumbar region: Secondary | ICD-10-CM | POA: Diagnosis not present

## 2023-05-17 DIAGNOSIS — E119 Type 2 diabetes mellitus without complications: Secondary | ICD-10-CM | POA: Diagnosis not present

## 2023-05-17 DIAGNOSIS — I10 Essential (primary) hypertension: Secondary | ICD-10-CM | POA: Diagnosis not present

## 2023-05-17 DIAGNOSIS — M48061 Spinal stenosis, lumbar region without neurogenic claudication: Secondary | ICD-10-CM | POA: Diagnosis not present

## 2023-05-17 DIAGNOSIS — N281 Cyst of kidney, acquired: Secondary | ICD-10-CM | POA: Diagnosis not present

## 2023-05-22 DIAGNOSIS — Z7984 Long term (current) use of oral hypoglycemic drugs: Secondary | ICD-10-CM | POA: Diagnosis not present

## 2023-05-22 DIAGNOSIS — Z9181 History of falling: Secondary | ICD-10-CM | POA: Diagnosis not present

## 2023-05-22 DIAGNOSIS — Z8672 Personal history of thrombophlebitis: Secondary | ICD-10-CM | POA: Diagnosis not present

## 2023-05-22 DIAGNOSIS — Z7902 Long term (current) use of antithrombotics/antiplatelets: Secondary | ICD-10-CM | POA: Diagnosis not present

## 2023-05-22 DIAGNOSIS — I10 Essential (primary) hypertension: Secondary | ICD-10-CM | POA: Diagnosis not present

## 2023-05-22 DIAGNOSIS — E785 Hyperlipidemia, unspecified: Secondary | ICD-10-CM | POA: Diagnosis not present

## 2023-05-22 DIAGNOSIS — M47815 Spondylosis without myelopathy or radiculopathy, thoracolumbar region: Secondary | ICD-10-CM | POA: Diagnosis not present

## 2023-05-22 DIAGNOSIS — Z466 Encounter for fitting and adjustment of urinary device: Secondary | ICD-10-CM | POA: Diagnosis not present

## 2023-05-22 DIAGNOSIS — M48061 Spinal stenosis, lumbar region without neurogenic claudication: Secondary | ICD-10-CM | POA: Diagnosis not present

## 2023-05-22 DIAGNOSIS — E119 Type 2 diabetes mellitus without complications: Secondary | ICD-10-CM | POA: Diagnosis not present

## 2023-05-22 DIAGNOSIS — R339 Retention of urine, unspecified: Secondary | ICD-10-CM | POA: Diagnosis not present

## 2023-05-22 DIAGNOSIS — M109 Gout, unspecified: Secondary | ICD-10-CM | POA: Diagnosis not present

## 2023-05-22 DIAGNOSIS — N281 Cyst of kidney, acquired: Secondary | ICD-10-CM | POA: Diagnosis not present

## 2023-05-23 DIAGNOSIS — N281 Cyst of kidney, acquired: Secondary | ICD-10-CM | POA: Diagnosis not present

## 2023-05-23 DIAGNOSIS — R339 Retention of urine, unspecified: Secondary | ICD-10-CM | POA: Diagnosis not present

## 2023-05-23 DIAGNOSIS — E119 Type 2 diabetes mellitus without complications: Secondary | ICD-10-CM | POA: Diagnosis not present

## 2023-05-23 DIAGNOSIS — I10 Essential (primary) hypertension: Secondary | ICD-10-CM | POA: Diagnosis not present

## 2023-05-23 DIAGNOSIS — M48061 Spinal stenosis, lumbar region without neurogenic claudication: Secondary | ICD-10-CM | POA: Diagnosis not present

## 2023-05-23 DIAGNOSIS — M47815 Spondylosis without myelopathy or radiculopathy, thoracolumbar region: Secondary | ICD-10-CM | POA: Diagnosis not present

## 2023-05-27 DIAGNOSIS — M47815 Spondylosis without myelopathy or radiculopathy, thoracolumbar region: Secondary | ICD-10-CM | POA: Diagnosis not present

## 2023-05-27 DIAGNOSIS — I10 Essential (primary) hypertension: Secondary | ICD-10-CM | POA: Diagnosis not present

## 2023-05-27 DIAGNOSIS — M48061 Spinal stenosis, lumbar region without neurogenic claudication: Secondary | ICD-10-CM | POA: Diagnosis not present

## 2023-05-27 DIAGNOSIS — N281 Cyst of kidney, acquired: Secondary | ICD-10-CM | POA: Diagnosis not present

## 2023-05-27 DIAGNOSIS — E119 Type 2 diabetes mellitus without complications: Secondary | ICD-10-CM | POA: Diagnosis not present

## 2023-05-27 DIAGNOSIS — R339 Retention of urine, unspecified: Secondary | ICD-10-CM | POA: Diagnosis not present

## 2023-05-27 DIAGNOSIS — R338 Other retention of urine: Secondary | ICD-10-CM | POA: Diagnosis not present

## 2023-05-29 DIAGNOSIS — M48062 Spinal stenosis, lumbar region with neurogenic claudication: Secondary | ICD-10-CM | POA: Diagnosis not present

## 2023-05-29 DIAGNOSIS — R159 Full incontinence of feces: Secondary | ICD-10-CM | POA: Diagnosis not present

## 2023-05-29 DIAGNOSIS — E1169 Type 2 diabetes mellitus with other specified complication: Secondary | ICD-10-CM | POA: Diagnosis not present

## 2023-05-29 DIAGNOSIS — N1831 Chronic kidney disease, stage 3a: Secondary | ICD-10-CM | POA: Diagnosis not present

## 2023-05-29 DIAGNOSIS — K592 Neurogenic bowel, not elsewhere classified: Secondary | ICD-10-CM | POA: Diagnosis not present

## 2023-05-29 DIAGNOSIS — N319 Neuromuscular dysfunction of bladder, unspecified: Secondary | ICD-10-CM | POA: Diagnosis not present

## 2023-05-31 DIAGNOSIS — R339 Retention of urine, unspecified: Secondary | ICD-10-CM | POA: Diagnosis not present

## 2023-05-31 DIAGNOSIS — Z7984 Long term (current) use of oral hypoglycemic drugs: Secondary | ICD-10-CM | POA: Diagnosis not present

## 2023-05-31 DIAGNOSIS — Z8672 Personal history of thrombophlebitis: Secondary | ICD-10-CM | POA: Diagnosis not present

## 2023-05-31 DIAGNOSIS — N281 Cyst of kidney, acquired: Secondary | ICD-10-CM | POA: Diagnosis not present

## 2023-05-31 DIAGNOSIS — Z466 Encounter for fitting and adjustment of urinary device: Secondary | ICD-10-CM | POA: Diagnosis not present

## 2023-05-31 DIAGNOSIS — E119 Type 2 diabetes mellitus without complications: Secondary | ICD-10-CM | POA: Diagnosis not present

## 2023-05-31 DIAGNOSIS — Z7902 Long term (current) use of antithrombotics/antiplatelets: Secondary | ICD-10-CM | POA: Diagnosis not present

## 2023-05-31 DIAGNOSIS — E785 Hyperlipidemia, unspecified: Secondary | ICD-10-CM | POA: Diagnosis not present

## 2023-05-31 DIAGNOSIS — M47815 Spondylosis without myelopathy or radiculopathy, thoracolumbar region: Secondary | ICD-10-CM | POA: Diagnosis not present

## 2023-05-31 DIAGNOSIS — M48061 Spinal stenosis, lumbar region without neurogenic claudication: Secondary | ICD-10-CM | POA: Diagnosis not present

## 2023-05-31 DIAGNOSIS — I10 Essential (primary) hypertension: Secondary | ICD-10-CM | POA: Diagnosis not present

## 2023-05-31 DIAGNOSIS — M109 Gout, unspecified: Secondary | ICD-10-CM | POA: Diagnosis not present

## 2023-06-04 DIAGNOSIS — R339 Retention of urine, unspecified: Secondary | ICD-10-CM | POA: Diagnosis not present

## 2023-06-04 DIAGNOSIS — M48061 Spinal stenosis, lumbar region without neurogenic claudication: Secondary | ICD-10-CM | POA: Diagnosis not present

## 2023-06-04 DIAGNOSIS — I10 Essential (primary) hypertension: Secondary | ICD-10-CM | POA: Diagnosis not present

## 2023-06-04 DIAGNOSIS — N281 Cyst of kidney, acquired: Secondary | ICD-10-CM | POA: Diagnosis not present

## 2023-06-04 DIAGNOSIS — E119 Type 2 diabetes mellitus without complications: Secondary | ICD-10-CM | POA: Diagnosis not present

## 2023-06-04 DIAGNOSIS — M47815 Spondylosis without myelopathy or radiculopathy, thoracolumbar region: Secondary | ICD-10-CM | POA: Diagnosis not present

## 2023-06-12 DIAGNOSIS — N281 Cyst of kidney, acquired: Secondary | ICD-10-CM | POA: Diagnosis not present

## 2023-06-12 DIAGNOSIS — I10 Essential (primary) hypertension: Secondary | ICD-10-CM | POA: Diagnosis not present

## 2023-06-12 DIAGNOSIS — E119 Type 2 diabetes mellitus without complications: Secondary | ICD-10-CM | POA: Diagnosis not present

## 2023-06-12 DIAGNOSIS — M47815 Spondylosis without myelopathy or radiculopathy, thoracolumbar region: Secondary | ICD-10-CM | POA: Diagnosis not present

## 2023-06-12 DIAGNOSIS — R339 Retention of urine, unspecified: Secondary | ICD-10-CM | POA: Diagnosis not present

## 2023-06-12 DIAGNOSIS — M48061 Spinal stenosis, lumbar region without neurogenic claudication: Secondary | ICD-10-CM | POA: Diagnosis not present

## 2023-06-21 DIAGNOSIS — Z9181 History of falling: Secondary | ICD-10-CM | POA: Diagnosis not present

## 2023-06-21 DIAGNOSIS — N281 Cyst of kidney, acquired: Secondary | ICD-10-CM | POA: Diagnosis not present

## 2023-06-21 DIAGNOSIS — Z7902 Long term (current) use of antithrombotics/antiplatelets: Secondary | ICD-10-CM | POA: Diagnosis not present

## 2023-06-21 DIAGNOSIS — Z7984 Long term (current) use of oral hypoglycemic drugs: Secondary | ICD-10-CM | POA: Diagnosis not present

## 2023-06-21 DIAGNOSIS — I10 Essential (primary) hypertension: Secondary | ICD-10-CM | POA: Diagnosis not present

## 2023-06-21 DIAGNOSIS — Z466 Encounter for fitting and adjustment of urinary device: Secondary | ICD-10-CM | POA: Diagnosis not present

## 2023-06-21 DIAGNOSIS — R339 Retention of urine, unspecified: Secondary | ICD-10-CM | POA: Diagnosis not present

## 2023-06-21 DIAGNOSIS — E119 Type 2 diabetes mellitus without complications: Secondary | ICD-10-CM | POA: Diagnosis not present

## 2023-06-21 DIAGNOSIS — Z8672 Personal history of thrombophlebitis: Secondary | ICD-10-CM | POA: Diagnosis not present

## 2023-06-21 DIAGNOSIS — M48061 Spinal stenosis, lumbar region without neurogenic claudication: Secondary | ICD-10-CM | POA: Diagnosis not present

## 2023-06-21 DIAGNOSIS — E785 Hyperlipidemia, unspecified: Secondary | ICD-10-CM | POA: Diagnosis not present

## 2023-06-21 DIAGNOSIS — M47815 Spondylosis without myelopathy or radiculopathy, thoracolumbar region: Secondary | ICD-10-CM | POA: Diagnosis not present

## 2023-06-21 DIAGNOSIS — M109 Gout, unspecified: Secondary | ICD-10-CM | POA: Diagnosis not present

## 2023-06-27 DIAGNOSIS — I10 Essential (primary) hypertension: Secondary | ICD-10-CM | POA: Diagnosis not present

## 2023-06-27 DIAGNOSIS — R339 Retention of urine, unspecified: Secondary | ICD-10-CM | POA: Diagnosis not present

## 2023-06-27 DIAGNOSIS — M47815 Spondylosis without myelopathy or radiculopathy, thoracolumbar region: Secondary | ICD-10-CM | POA: Diagnosis not present

## 2023-06-27 DIAGNOSIS — M48061 Spinal stenosis, lumbar region without neurogenic claudication: Secondary | ICD-10-CM | POA: Diagnosis not present

## 2023-06-27 DIAGNOSIS — E119 Type 2 diabetes mellitus without complications: Secondary | ICD-10-CM | POA: Diagnosis not present

## 2023-06-27 DIAGNOSIS — N281 Cyst of kidney, acquired: Secondary | ICD-10-CM | POA: Diagnosis not present

## 2023-07-09 DIAGNOSIS — E119 Type 2 diabetes mellitus without complications: Secondary | ICD-10-CM | POA: Diagnosis not present

## 2023-07-09 DIAGNOSIS — M48061 Spinal stenosis, lumbar region without neurogenic claudication: Secondary | ICD-10-CM | POA: Diagnosis not present

## 2023-07-09 DIAGNOSIS — M47815 Spondylosis without myelopathy or radiculopathy, thoracolumbar region: Secondary | ICD-10-CM | POA: Diagnosis not present

## 2023-07-09 DIAGNOSIS — N281 Cyst of kidney, acquired: Secondary | ICD-10-CM | POA: Diagnosis not present

## 2023-07-09 DIAGNOSIS — I10 Essential (primary) hypertension: Secondary | ICD-10-CM | POA: Diagnosis not present

## 2023-07-09 DIAGNOSIS — R339 Retention of urine, unspecified: Secondary | ICD-10-CM | POA: Diagnosis not present

## 2023-07-15 DIAGNOSIS — M48062 Spinal stenosis, lumbar region with neurogenic claudication: Secondary | ICD-10-CM | POA: Diagnosis not present

## 2023-07-15 DIAGNOSIS — N1831 Chronic kidney disease, stage 3a: Secondary | ICD-10-CM | POA: Diagnosis not present

## 2023-07-15 DIAGNOSIS — E1169 Type 2 diabetes mellitus with other specified complication: Secondary | ICD-10-CM | POA: Diagnosis not present

## 2023-07-15 DIAGNOSIS — M109 Gout, unspecified: Secondary | ICD-10-CM | POA: Diagnosis not present

## 2023-07-15 DIAGNOSIS — I129 Hypertensive chronic kidney disease with stage 1 through stage 4 chronic kidney disease, or unspecified chronic kidney disease: Secondary | ICD-10-CM | POA: Diagnosis not present

## 2023-07-15 DIAGNOSIS — Z8673 Personal history of transient ischemic attack (TIA), and cerebral infarction without residual deficits: Secondary | ICD-10-CM | POA: Diagnosis not present

## 2023-07-29 DIAGNOSIS — R338 Other retention of urine: Secondary | ICD-10-CM | POA: Diagnosis not present

## 2023-07-30 DIAGNOSIS — L851 Acquired keratosis [keratoderma] palmaris et plantaris: Secondary | ICD-10-CM | POA: Diagnosis not present

## 2023-07-30 DIAGNOSIS — E1151 Type 2 diabetes mellitus with diabetic peripheral angiopathy without gangrene: Secondary | ICD-10-CM | POA: Diagnosis not present

## 2023-07-30 DIAGNOSIS — M2011 Hallux valgus (acquired), right foot: Secondary | ICD-10-CM | POA: Diagnosis not present

## 2023-07-30 DIAGNOSIS — I739 Peripheral vascular disease, unspecified: Secondary | ICD-10-CM | POA: Diagnosis not present

## 2023-07-30 DIAGNOSIS — M2042 Other hammer toe(s) (acquired), left foot: Secondary | ICD-10-CM | POA: Diagnosis not present

## 2023-07-30 DIAGNOSIS — M79675 Pain in left toe(s): Secondary | ICD-10-CM | POA: Diagnosis not present

## 2023-07-30 DIAGNOSIS — M2012 Hallux valgus (acquired), left foot: Secondary | ICD-10-CM | POA: Diagnosis not present

## 2023-07-30 DIAGNOSIS — M79674 Pain in right toe(s): Secondary | ICD-10-CM | POA: Diagnosis not present

## 2023-07-30 DIAGNOSIS — M2041 Other hammer toe(s) (acquired), right foot: Secondary | ICD-10-CM | POA: Diagnosis not present

## 2023-07-30 DIAGNOSIS — B351 Tinea unguium: Secondary | ICD-10-CM | POA: Diagnosis not present

## 2023-07-30 DIAGNOSIS — L84 Corns and callosities: Secondary | ICD-10-CM | POA: Diagnosis not present

## 2023-08-05 DIAGNOSIS — N1832 Chronic kidney disease, stage 3b: Secondary | ICD-10-CM | POA: Diagnosis not present

## 2023-08-05 DIAGNOSIS — R338 Other retention of urine: Secondary | ICD-10-CM | POA: Diagnosis not present

## 2023-08-23 DIAGNOSIS — Z961 Presence of intraocular lens: Secondary | ICD-10-CM | POA: Diagnosis not present

## 2023-08-23 DIAGNOSIS — E119 Type 2 diabetes mellitus without complications: Secondary | ICD-10-CM | POA: Diagnosis not present

## 2023-08-23 DIAGNOSIS — H26492 Other secondary cataract, left eye: Secondary | ICD-10-CM | POA: Diagnosis not present

## 2023-08-23 DIAGNOSIS — H52203 Unspecified astigmatism, bilateral: Secondary | ICD-10-CM | POA: Diagnosis not present

## 2023-08-26 DIAGNOSIS — N1832 Chronic kidney disease, stage 3b: Secondary | ICD-10-CM | POA: Diagnosis not present

## 2023-08-26 DIAGNOSIS — I129 Hypertensive chronic kidney disease with stage 1 through stage 4 chronic kidney disease, or unspecified chronic kidney disease: Secondary | ICD-10-CM | POA: Diagnosis not present

## 2023-08-26 DIAGNOSIS — E1122 Type 2 diabetes mellitus with diabetic chronic kidney disease: Secondary | ICD-10-CM | POA: Diagnosis not present

## 2023-08-26 DIAGNOSIS — R339 Retention of urine, unspecified: Secondary | ICD-10-CM | POA: Diagnosis not present

## 2023-09-02 DIAGNOSIS — K219 Gastro-esophageal reflux disease without esophagitis: Secondary | ICD-10-CM | POA: Diagnosis not present

## 2023-09-02 DIAGNOSIS — G8929 Other chronic pain: Secondary | ICD-10-CM | POA: Diagnosis not present

## 2023-09-02 DIAGNOSIS — N1831 Chronic kidney disease, stage 3a: Secondary | ICD-10-CM | POA: Diagnosis not present

## 2023-09-02 DIAGNOSIS — R051 Acute cough: Secondary | ICD-10-CM | POA: Diagnosis not present

## 2023-09-02 DIAGNOSIS — J069 Acute upper respiratory infection, unspecified: Secondary | ICD-10-CM | POA: Diagnosis not present

## 2023-09-02 DIAGNOSIS — M48062 Spinal stenosis, lumbar region with neurogenic claudication: Secondary | ICD-10-CM | POA: Diagnosis not present

## 2023-09-02 DIAGNOSIS — I129 Hypertensive chronic kidney disease with stage 1 through stage 4 chronic kidney disease, or unspecified chronic kidney disease: Secondary | ICD-10-CM | POA: Diagnosis not present

## 2023-10-01 DIAGNOSIS — Z77122 Contact with and (suspected) exposure to noise: Secondary | ICD-10-CM | POA: Diagnosis not present

## 2023-10-01 DIAGNOSIS — H903 Sensorineural hearing loss, bilateral: Secondary | ICD-10-CM | POA: Diagnosis not present

## 2023-10-08 DIAGNOSIS — Z23 Encounter for immunization: Secondary | ICD-10-CM | POA: Diagnosis not present

## 2023-10-29 DIAGNOSIS — M2041 Other hammer toe(s) (acquired), right foot: Secondary | ICD-10-CM | POA: Diagnosis not present

## 2023-10-29 DIAGNOSIS — M79674 Pain in right toe(s): Secondary | ICD-10-CM | POA: Diagnosis not present

## 2023-10-29 DIAGNOSIS — M2042 Other hammer toe(s) (acquired), left foot: Secondary | ICD-10-CM | POA: Diagnosis not present

## 2023-10-29 DIAGNOSIS — L84 Corns and callosities: Secondary | ICD-10-CM | POA: Diagnosis not present

## 2023-10-29 DIAGNOSIS — I739 Peripheral vascular disease, unspecified: Secondary | ICD-10-CM | POA: Diagnosis not present

## 2023-10-29 DIAGNOSIS — M79675 Pain in left toe(s): Secondary | ICD-10-CM | POA: Diagnosis not present

## 2023-10-29 DIAGNOSIS — E1151 Type 2 diabetes mellitus with diabetic peripheral angiopathy without gangrene: Secondary | ICD-10-CM | POA: Diagnosis not present

## 2023-10-29 DIAGNOSIS — M2011 Hallux valgus (acquired), right foot: Secondary | ICD-10-CM | POA: Diagnosis not present

## 2023-10-29 DIAGNOSIS — B351 Tinea unguium: Secondary | ICD-10-CM | POA: Diagnosis not present

## 2023-10-29 DIAGNOSIS — L309 Dermatitis, unspecified: Secondary | ICD-10-CM | POA: Diagnosis not present

## 2023-10-29 DIAGNOSIS — M2012 Hallux valgus (acquired), left foot: Secondary | ICD-10-CM | POA: Diagnosis not present

## 2023-11-02 DIAGNOSIS — Z23 Encounter for immunization: Secondary | ICD-10-CM | POA: Diagnosis not present

## 2023-11-26 DIAGNOSIS — N1832 Chronic kidney disease, stage 3b: Secondary | ICD-10-CM | POA: Diagnosis not present

## 2023-12-16 ENCOUNTER — Other Ambulatory Visit: Payer: Self-pay

## 2023-12-16 ENCOUNTER — Other Ambulatory Visit (HOSPITAL_COMMUNITY): Payer: Self-pay

## 2023-12-16 DIAGNOSIS — R338 Other retention of urine: Secondary | ICD-10-CM | POA: Diagnosis not present

## 2023-12-16 DIAGNOSIS — N183 Chronic kidney disease, stage 3 unspecified: Secondary | ICD-10-CM | POA: Diagnosis not present

## 2023-12-16 MED ORDER — JANUVIA 100 MG PO TABS
100.0000 mg | ORAL_TABLET | Freq: Every day | ORAL | 3 refills | Status: AC
  Filled 2023-12-16: qty 30, 30d supply, fill #0

## 2023-12-17 ENCOUNTER — Other Ambulatory Visit: Payer: Self-pay
